# Patient Record
Sex: Female | Born: 1945 | Race: Black or African American | Hispanic: No | Smoking: Never smoker
Health system: Southern US, Community
[De-identification: ages and names within clinical notes are randomized; demographics above are authoritative.]

## PROBLEM LIST (undated history)

## (undated) DIAGNOSIS — M545 Low back pain, unspecified: Secondary | ICD-10-CM

## (undated) DIAGNOSIS — R519 Headache, unspecified: Secondary | ICD-10-CM

## (undated) DIAGNOSIS — K219 Gastro-esophageal reflux disease without esophagitis: Secondary | ICD-10-CM

## (undated) DIAGNOSIS — Z86010 Personal history of colonic polyps: Secondary | ICD-10-CM

## (undated) DIAGNOSIS — D509 Iron deficiency anemia, unspecified: Secondary | ICD-10-CM

## (undated) DIAGNOSIS — G8929 Other chronic pain: Secondary | ICD-10-CM

## (undated) DIAGNOSIS — M797 Fibromyalgia: Secondary | ICD-10-CM

## (undated) DIAGNOSIS — M329 Systemic lupus erythematosus, unspecified: Secondary | ICD-10-CM

## (undated) DIAGNOSIS — R51 Headache: Secondary | ICD-10-CM

## (undated) DIAGNOSIS — M26609 Unspecified temporomandibular joint disorder, unspecified side: Secondary | ICD-10-CM

## (undated) DIAGNOSIS — Z853 Personal history of malignant neoplasm of breast: Secondary | ICD-10-CM

## (undated) DIAGNOSIS — M4800 Spinal stenosis, site unspecified: Secondary | ICD-10-CM

## (undated) DIAGNOSIS — I10 Essential (primary) hypertension: Secondary | ICD-10-CM

## (undated) DIAGNOSIS — K222 Esophageal obstruction: Secondary | ICD-10-CM

## (undated) DIAGNOSIS — F329 Major depressive disorder, single episode, unspecified: Secondary | ICD-10-CM

## (undated) DIAGNOSIS — M1990 Unspecified osteoarthritis, unspecified site: Secondary | ICD-10-CM

## (undated) DIAGNOSIS — K648 Other hemorrhoids: Secondary | ICD-10-CM

## (undated) DIAGNOSIS — F32A Depression, unspecified: Secondary | ICD-10-CM

## (undated) HISTORY — DX: Unspecified osteoarthritis, unspecified site: M19.90

## (undated) HISTORY — DX: Unspecified temporomandibular joint disorder, unspecified side: M26.609

## (undated) HISTORY — DX: Iron deficiency anemia, unspecified: D50.9

## (undated) HISTORY — DX: Personal history of colonic polyps: Z86.010

## (undated) HISTORY — PX: BUNIONECTOMY: SHX129

## (undated) HISTORY — DX: Other hemorrhoids: K64.8

## (undated) HISTORY — DX: Major depressive disorder, single episode, unspecified: F32.9

## (undated) HISTORY — DX: Gastro-esophageal reflux disease without esophagitis: K21.9

## (undated) HISTORY — DX: Fibromyalgia: M79.7

## (undated) HISTORY — DX: Systemic lupus erythematosus, unspecified: M32.9

## (undated) HISTORY — DX: Headache, unspecified: R51.9

## (undated) HISTORY — PX: ABDOMINAL HYSTERECTOMY: SHX81

## (undated) HISTORY — DX: Headache: R51

## (undated) HISTORY — PX: APPENDECTOMY: SHX54

## (undated) HISTORY — DX: Other chronic pain: G89.29

## (undated) HISTORY — DX: Depression, unspecified: F32.A

## (undated) HISTORY — PX: PITUITARY EXCISION: SHX745

## (undated) HISTORY — PX: BREAST LUMPECTOMY: SHX2

## (undated) HISTORY — DX: Essential (primary) hypertension: I10

## (undated) HISTORY — PX: EYE SURGERY: SHX253

## (undated) HISTORY — DX: Low back pain: M54.5

## (undated) HISTORY — DX: Low back pain, unspecified: M54.50

## (undated) HISTORY — DX: Spinal stenosis, site unspecified: M48.00

## (undated) HISTORY — DX: Personal history of malignant neoplasm of breast: Z85.3

## (undated) HISTORY — DX: Esophageal obstruction: K22.2

---

## 2000-05-24 ENCOUNTER — Encounter: Admission: RE | Admit: 2000-05-24 | Discharge: 2000-05-24 | Payer: Self-pay | Admitting: Endocrinology

## 2000-06-23 ENCOUNTER — Encounter: Admission: RE | Admit: 2000-06-23 | Discharge: 2000-06-23 | Payer: Self-pay | Admitting: Internal Medicine

## 2000-06-29 ENCOUNTER — Encounter: Payer: Self-pay | Admitting: Internal Medicine

## 2000-06-29 ENCOUNTER — Encounter: Admission: RE | Admit: 2000-06-29 | Discharge: 2000-06-29 | Payer: Self-pay | Admitting: Hematology and Oncology

## 2000-06-29 ENCOUNTER — Ambulatory Visit (HOSPITAL_COMMUNITY): Admission: RE | Admit: 2000-06-29 | Discharge: 2000-06-29 | Payer: Self-pay | Admitting: Hematology and Oncology

## 2000-07-01 ENCOUNTER — Encounter: Admission: RE | Admit: 2000-07-01 | Discharge: 2000-07-01 | Payer: Self-pay | Admitting: Hematology and Oncology

## 2000-07-18 ENCOUNTER — Emergency Department (HOSPITAL_COMMUNITY): Admission: EM | Admit: 2000-07-18 | Discharge: 2000-07-18 | Payer: Self-pay | Admitting: Emergency Medicine

## 2000-12-20 ENCOUNTER — Emergency Department (HOSPITAL_COMMUNITY): Admission: EM | Admit: 2000-12-20 | Discharge: 2000-12-20 | Payer: Self-pay | Admitting: Emergency Medicine

## 2000-12-22 ENCOUNTER — Emergency Department (HOSPITAL_COMMUNITY): Admission: EM | Admit: 2000-12-22 | Discharge: 2000-12-23 | Payer: Self-pay | Admitting: Emergency Medicine

## 2000-12-23 ENCOUNTER — Encounter: Payer: Self-pay | Admitting: Emergency Medicine

## 2003-03-28 ENCOUNTER — Encounter: Payer: Self-pay | Admitting: Emergency Medicine

## 2003-03-28 ENCOUNTER — Emergency Department (HOSPITAL_COMMUNITY): Admission: EM | Admit: 2003-03-28 | Discharge: 2003-03-28 | Payer: Self-pay | Admitting: Emergency Medicine

## 2003-05-10 ENCOUNTER — Encounter: Admission: RE | Admit: 2003-05-10 | Discharge: 2003-05-10 | Payer: Self-pay | Admitting: Rheumatology

## 2003-05-10 ENCOUNTER — Encounter: Payer: Self-pay | Admitting: Rheumatology

## 2003-05-11 ENCOUNTER — Encounter: Payer: Self-pay | Admitting: Internal Medicine

## 2003-05-11 ENCOUNTER — Ambulatory Visit (HOSPITAL_COMMUNITY): Admission: RE | Admit: 2003-05-11 | Discharge: 2003-05-11 | Payer: Self-pay | Admitting: Internal Medicine

## 2003-05-15 ENCOUNTER — Encounter: Payer: Self-pay | Admitting: Internal Medicine

## 2003-05-15 ENCOUNTER — Ambulatory Visit (HOSPITAL_COMMUNITY): Admission: RE | Admit: 2003-05-15 | Discharge: 2003-05-15 | Payer: Self-pay | Admitting: Internal Medicine

## 2003-05-18 ENCOUNTER — Encounter: Admission: RE | Admit: 2003-05-18 | Discharge: 2003-05-18 | Payer: Self-pay | Admitting: Rheumatology

## 2003-05-18 ENCOUNTER — Encounter: Payer: Self-pay | Admitting: Rheumatology

## 2003-08-05 ENCOUNTER — Ambulatory Visit (HOSPITAL_COMMUNITY): Admission: RE | Admit: 2003-08-05 | Discharge: 2003-08-05 | Payer: Self-pay | Admitting: Internal Medicine

## 2003-08-05 ENCOUNTER — Encounter: Payer: Self-pay | Admitting: Internal Medicine

## 2003-08-17 ENCOUNTER — Ambulatory Visit (HOSPITAL_COMMUNITY): Admission: RE | Admit: 2003-08-17 | Discharge: 2003-08-17 | Payer: Self-pay | Admitting: Endocrinology

## 2003-08-17 ENCOUNTER — Encounter: Payer: Self-pay | Admitting: Endocrinology

## 2003-08-17 ENCOUNTER — Encounter (INDEPENDENT_AMBULATORY_CARE_PROVIDER_SITE_OTHER): Payer: Self-pay | Admitting: *Deleted

## 2003-10-06 ENCOUNTER — Emergency Department (HOSPITAL_COMMUNITY): Admission: EM | Admit: 2003-10-06 | Discharge: 2003-10-07 | Payer: Self-pay | Admitting: Emergency Medicine

## 2003-10-08 ENCOUNTER — Inpatient Hospital Stay (HOSPITAL_COMMUNITY): Admission: EM | Admit: 2003-10-08 | Discharge: 2003-10-13 | Payer: Self-pay | Admitting: Emergency Medicine

## 2003-10-11 ENCOUNTER — Encounter: Payer: Self-pay | Admitting: Gastroenterology

## 2003-12-11 ENCOUNTER — Encounter: Payer: Self-pay | Admitting: Gastroenterology

## 2003-12-11 ENCOUNTER — Ambulatory Visit (HOSPITAL_COMMUNITY): Admission: RE | Admit: 2003-12-11 | Discharge: 2003-12-11 | Payer: Self-pay | Admitting: Gastroenterology

## 2003-12-23 ENCOUNTER — Emergency Department (HOSPITAL_COMMUNITY): Admission: EM | Admit: 2003-12-23 | Discharge: 2003-12-23 | Payer: Self-pay | Admitting: Emergency Medicine

## 2004-05-02 ENCOUNTER — Ambulatory Visit (HOSPITAL_COMMUNITY): Admission: RE | Admit: 2004-05-02 | Discharge: 2004-05-03 | Payer: Self-pay | Admitting: Ophthalmology

## 2004-06-11 ENCOUNTER — Encounter: Admission: RE | Admit: 2004-06-11 | Discharge: 2004-06-11 | Payer: Self-pay | Admitting: Internal Medicine

## 2004-07-05 ENCOUNTER — Emergency Department (HOSPITAL_COMMUNITY): Admission: EM | Admit: 2004-07-05 | Discharge: 2004-07-06 | Payer: Self-pay | Admitting: Emergency Medicine

## 2004-11-26 DIAGNOSIS — Z86010 Personal history of colonic polyps: Secondary | ICD-10-CM

## 2004-11-26 DIAGNOSIS — Z860101 Personal history of adenomatous and serrated colon polyps: Secondary | ICD-10-CM

## 2004-11-26 HISTORY — DX: Personal history of adenomatous and serrated colon polyps: Z86.0101

## 2004-11-26 HISTORY — DX: Personal history of colonic polyps: Z86.010

## 2004-12-03 ENCOUNTER — Ambulatory Visit: Payer: Self-pay | Admitting: Endocrinology

## 2004-12-05 ENCOUNTER — Encounter: Payer: Self-pay | Admitting: Gastroenterology

## 2004-12-18 ENCOUNTER — Ambulatory Visit: Payer: Self-pay | Admitting: Internal Medicine

## 2004-12-21 ENCOUNTER — Encounter: Payer: Self-pay | Admitting: Gastroenterology

## 2005-01-06 ENCOUNTER — Ambulatory Visit: Payer: Self-pay | Admitting: Internal Medicine

## 2005-07-09 ENCOUNTER — Ambulatory Visit: Payer: Self-pay | Admitting: Internal Medicine

## 2005-10-13 ENCOUNTER — Ambulatory Visit: Payer: Self-pay | Admitting: Internal Medicine

## 2006-01-06 ENCOUNTER — Ambulatory Visit: Payer: Self-pay | Admitting: Internal Medicine

## 2006-01-07 ENCOUNTER — Ambulatory Visit: Payer: Self-pay | Admitting: Internal Medicine

## 2006-01-26 ENCOUNTER — Ambulatory Visit (HOSPITAL_COMMUNITY): Admission: RE | Admit: 2006-01-26 | Discharge: 2006-01-26 | Payer: Self-pay | Admitting: Internal Medicine

## 2006-02-01 ENCOUNTER — Ambulatory Visit: Payer: Self-pay | Admitting: Internal Medicine

## 2006-02-24 ENCOUNTER — Ambulatory Visit (HOSPITAL_COMMUNITY): Admission: RE | Admit: 2006-02-24 | Discharge: 2006-02-24 | Payer: Self-pay | Admitting: Internal Medicine

## 2006-02-26 ENCOUNTER — Ambulatory Visit (HOSPITAL_COMMUNITY): Admission: RE | Admit: 2006-02-26 | Discharge: 2006-02-26 | Payer: Self-pay | Admitting: Neurosurgery

## 2006-03-12 ENCOUNTER — Ambulatory Visit: Payer: Self-pay | Admitting: Internal Medicine

## 2006-03-16 ENCOUNTER — Ambulatory Visit: Payer: Self-pay | Admitting: Gastroenterology

## 2006-04-13 ENCOUNTER — Ambulatory Visit: Payer: Self-pay | Admitting: Gastroenterology

## 2006-07-01 ENCOUNTER — Ambulatory Visit: Payer: Self-pay | Admitting: Internal Medicine

## 2006-12-17 ENCOUNTER — Ambulatory Visit: Payer: Self-pay | Admitting: Internal Medicine

## 2006-12-17 LAB — CONVERTED CEMR LAB
BUN: 9 mg/dL (ref 6–23)
CO2: 31 meq/L (ref 19–32)
Calcium: 9.2 mg/dL (ref 8.4–10.5)
Glucose, Bld: 101 mg/dL — ABNORMAL HIGH (ref 70–99)
Sodium: 142 meq/L (ref 135–145)

## 2007-05-13 ENCOUNTER — Ambulatory Visit: Payer: Self-pay | Admitting: Internal Medicine

## 2007-05-18 ENCOUNTER — Encounter: Admission: RE | Admit: 2007-05-18 | Discharge: 2007-05-18 | Payer: Self-pay | Admitting: Internal Medicine

## 2007-05-21 DIAGNOSIS — K219 Gastro-esophageal reflux disease without esophagitis: Secondary | ICD-10-CM | POA: Insufficient documentation

## 2007-05-21 DIAGNOSIS — I10 Essential (primary) hypertension: Secondary | ICD-10-CM | POA: Insufficient documentation

## 2007-05-21 DIAGNOSIS — Z853 Personal history of malignant neoplasm of breast: Secondary | ICD-10-CM | POA: Insufficient documentation

## 2007-05-21 DIAGNOSIS — R519 Headache, unspecified: Secondary | ICD-10-CM | POA: Insufficient documentation

## 2007-05-21 DIAGNOSIS — E0590 Thyrotoxicosis, unspecified without thyrotoxic crisis or storm: Secondary | ICD-10-CM | POA: Insufficient documentation

## 2007-05-21 DIAGNOSIS — R51 Headache: Secondary | ICD-10-CM | POA: Insufficient documentation

## 2007-05-26 ENCOUNTER — Ambulatory Visit: Payer: Self-pay | Admitting: Pulmonary Disease

## 2007-06-14 ENCOUNTER — Ambulatory Visit (HOSPITAL_BASED_OUTPATIENT_CLINIC_OR_DEPARTMENT_OTHER): Admission: RE | Admit: 2007-06-14 | Discharge: 2007-06-14 | Payer: Self-pay | Admitting: Pulmonary Disease

## 2007-06-14 ENCOUNTER — Encounter: Payer: Self-pay | Admitting: Internal Medicine

## 2007-06-24 ENCOUNTER — Encounter: Payer: Self-pay | Admitting: Internal Medicine

## 2007-06-25 ENCOUNTER — Ambulatory Visit: Payer: Self-pay | Admitting: Pulmonary Disease

## 2007-07-01 ENCOUNTER — Ambulatory Visit: Payer: Self-pay | Admitting: Pulmonary Disease

## 2007-07-01 ENCOUNTER — Ambulatory Visit: Payer: Self-pay | Admitting: Endocrinology

## 2007-07-03 ENCOUNTER — Encounter: Payer: Self-pay | Admitting: Endocrinology

## 2007-07-27 ENCOUNTER — Ambulatory Visit: Payer: Self-pay | Admitting: Endocrinology

## 2007-07-27 LAB — CONVERTED CEMR LAB
Cortisol, Plasma: 1.8 ug/dL
FSH: 56.3 milliintl units/mL
LH: 29.8 milliintl units/mL
Prolactin: 7.6 ng/mL

## 2007-08-29 ENCOUNTER — Telehealth: Payer: Self-pay | Admitting: Internal Medicine

## 2007-09-12 ENCOUNTER — Telehealth (INDEPENDENT_AMBULATORY_CARE_PROVIDER_SITE_OTHER): Payer: Self-pay | Admitting: *Deleted

## 2007-09-14 ENCOUNTER — Ambulatory Visit: Payer: Self-pay | Admitting: Internal Medicine

## 2007-09-14 DIAGNOSIS — M48061 Spinal stenosis, lumbar region without neurogenic claudication: Secondary | ICD-10-CM | POA: Insufficient documentation

## 2007-09-16 ENCOUNTER — Ambulatory Visit: Payer: Self-pay | Admitting: Endocrinology

## 2007-09-16 DIAGNOSIS — E237 Disorder of pituitary gland, unspecified: Secondary | ICD-10-CM | POA: Insufficient documentation

## 2007-09-19 LAB — CONVERTED CEMR LAB
CO2: 32 meq/L (ref 19–32)
Calcium: 9.2 mg/dL (ref 8.4–10.5)
Chloride: 102 meq/L (ref 96–112)
Creatinine, Ser: 0.8 mg/dL (ref 0.4–1.2)
Free T4: 0.6 ng/dL (ref 0.6–1.6)
GFR calc Af Amer: 93 mL/min
Potassium: 4.3 meq/L (ref 3.5–5.1)
TSH: 1.99 microintl units/mL (ref 0.35–5.50)

## 2007-10-03 ENCOUNTER — Encounter: Admission: RE | Admit: 2007-10-03 | Discharge: 2007-10-03 | Payer: Self-pay | Admitting: Neurosurgery

## 2007-10-05 ENCOUNTER — Telehealth: Payer: Self-pay | Admitting: Internal Medicine

## 2008-03-30 ENCOUNTER — Ambulatory Visit: Payer: Self-pay | Admitting: Internal Medicine

## 2008-03-30 DIAGNOSIS — Z860100 Personal history of colon polyps, unspecified: Secondary | ICD-10-CM | POA: Insufficient documentation

## 2008-03-30 DIAGNOSIS — D509 Iron deficiency anemia, unspecified: Secondary | ICD-10-CM | POA: Insufficient documentation

## 2008-03-30 DIAGNOSIS — G4710 Hypersomnia, unspecified: Secondary | ICD-10-CM | POA: Insufficient documentation

## 2008-03-30 DIAGNOSIS — R5381 Other malaise: Secondary | ICD-10-CM | POA: Insufficient documentation

## 2008-03-30 DIAGNOSIS — F329 Major depressive disorder, single episode, unspecified: Secondary | ICD-10-CM

## 2008-03-30 DIAGNOSIS — M2550 Pain in unspecified joint: Secondary | ICD-10-CM | POA: Insufficient documentation

## 2008-03-30 DIAGNOSIS — M545 Low back pain, unspecified: Secondary | ICD-10-CM | POA: Insufficient documentation

## 2008-03-30 DIAGNOSIS — R5383 Other fatigue: Secondary | ICD-10-CM

## 2008-03-30 DIAGNOSIS — F32A Depression, unspecified: Secondary | ICD-10-CM | POA: Insufficient documentation

## 2008-03-30 DIAGNOSIS — J309 Allergic rhinitis, unspecified: Secondary | ICD-10-CM | POA: Insufficient documentation

## 2008-03-30 LAB — CONVERTED CEMR LAB
ALT: 18 units/L (ref 0–35)
AST: 21 units/L (ref 0–37)
Albumin: 3.8 g/dL (ref 3.5–5.2)
Alkaline Phosphatase: 103 units/L (ref 39–117)
Basophils Absolute: 0.1 10*3/uL (ref 0.0–0.1)
Bilirubin, Direct: 0.1 mg/dL (ref 0.0–0.3)
Eosinophils Absolute: 0.2 10*3/uL (ref 0.0–0.7)
GFR calc Af Amer: 93 mL/min
GFR calc non Af Amer: 77 mL/min
Glucose, Bld: 90 mg/dL (ref 70–99)
Lymphocytes Relative: 48 % — ABNORMAL HIGH (ref 12.0–46.0)
MCV: 88.1 fL (ref 78.0–100.0)
Monocytes Absolute: 0.4 10*3/uL (ref 0.1–1.0)
Neutrophils Relative %: 40 % — ABNORMAL LOW (ref 43.0–77.0)
Nitrite: NEGATIVE
Potassium: 4.4 meq/L (ref 3.5–5.1)
RDW: 13.6 % (ref 11.5–14.6)
Sed Rate: 10 mm/hr (ref 0–22)
Specific Gravity, Urine: 1.01 (ref 1.000–1.03)
TSH: 1.62 microintl units/mL (ref 0.35–5.50)
Total Protein, Urine: NEGATIVE mg/dL
Total Protein: 6.9 g/dL (ref 6.0–8.3)
Urine Glucose: NEGATIVE mg/dL
WBC: 5.4 10*3/uL (ref 4.5–10.5)
pH: 6 (ref 5.0–8.0)

## 2008-04-03 LAB — CONVERTED CEMR LAB: ds DNA Ab: <1 (ref <5)

## 2008-04-06 ENCOUNTER — Telehealth: Payer: Self-pay | Admitting: Internal Medicine

## 2008-04-25 ENCOUNTER — Telehealth: Payer: Self-pay | Admitting: Internal Medicine

## 2008-05-10 ENCOUNTER — Ambulatory Visit: Payer: Self-pay | Admitting: Internal Medicine

## 2008-05-10 DIAGNOSIS — R82998 Other abnormal findings in urine: Secondary | ICD-10-CM | POA: Insufficient documentation

## 2008-05-10 LAB — CONVERTED CEMR LAB
Bilirubin Urine: NEGATIVE
Nitrite: NEGATIVE
Urine Glucose: NEGATIVE mg/dL
pH: 6 (ref 5.0–8.0)

## 2008-05-15 ENCOUNTER — Telehealth: Payer: Self-pay | Admitting: Internal Medicine

## 2008-05-22 ENCOUNTER — Telehealth: Payer: Self-pay | Admitting: Internal Medicine

## 2008-08-15 ENCOUNTER — Emergency Department (HOSPITAL_COMMUNITY): Admission: EM | Admit: 2008-08-15 | Discharge: 2008-08-15 | Payer: Self-pay | Admitting: Emergency Medicine

## 2008-08-16 ENCOUNTER — Telehealth: Payer: Self-pay | Admitting: Internal Medicine

## 2008-08-21 ENCOUNTER — Ambulatory Visit: Payer: Self-pay | Admitting: Internal Medicine

## 2008-08-27 ENCOUNTER — Telehealth: Payer: Self-pay | Admitting: Internal Medicine

## 2008-10-31 ENCOUNTER — Ambulatory Visit: Payer: Self-pay | Admitting: Internal Medicine

## 2008-10-31 DIAGNOSIS — M79609 Pain in unspecified limb: Secondary | ICD-10-CM | POA: Insufficient documentation

## 2008-10-31 DIAGNOSIS — N939 Abnormal uterine and vaginal bleeding, unspecified: Secondary | ICD-10-CM

## 2008-10-31 DIAGNOSIS — N926 Irregular menstruation, unspecified: Secondary | ICD-10-CM | POA: Insufficient documentation

## 2008-11-01 ENCOUNTER — Telehealth: Payer: Self-pay | Admitting: Internal Medicine

## 2008-11-28 ENCOUNTER — Encounter: Admission: RE | Admit: 2008-11-28 | Discharge: 2008-11-28 | Payer: Self-pay | Admitting: Obstetrics and Gynecology

## 2008-12-19 ENCOUNTER — Telehealth: Payer: Self-pay | Admitting: Internal Medicine

## 2008-12-26 ENCOUNTER — Encounter: Payer: Self-pay | Admitting: Internal Medicine

## 2009-05-07 ENCOUNTER — Ambulatory Visit: Payer: Self-pay | Admitting: Internal Medicine

## 2009-05-07 DIAGNOSIS — G4700 Insomnia, unspecified: Secondary | ICD-10-CM | POA: Insufficient documentation

## 2009-05-07 LAB — CONVERTED CEMR LAB
BUN: 11 mg/dL (ref 6–23)
Basophils Absolute: 0 10*3/uL (ref 0.0–0.1)
Basophils Relative: 0.7 % (ref 0.0–3.0)
Calcium: 9.2 mg/dL (ref 8.4–10.5)
Chloride: 101 meq/L (ref 96–112)
Eosinophils Absolute: 0.2 10*3/uL (ref 0.0–0.7)
Eosinophils Relative: 3.1 % (ref 0.0–5.0)
GFR calc non Af Amer: 92.66 mL/min (ref >60)
Glucose, Bld: 89 mg/dL (ref 70–99)
Hemoglobin: 13.3 g/dL (ref 12.0–15.0)
Lymphs Abs: 2.5 10*3/uL (ref 0.7–4.0)
MCHC: 34.8 g/dL (ref 30.0–36.0)
MCV: 89.4 fL (ref 78.0–100.0)
Monocytes Absolute: 0.5 10*3/uL (ref 0.1–1.0)
Potassium: 4.1 meq/L (ref 3.5–5.1)
RDW: 14.1 % (ref 11.5–14.6)
Sodium: 140 meq/L (ref 135–145)
TSH: 1.66 microintl units/mL (ref 0.35–5.50)

## 2009-05-12 ENCOUNTER — Encounter: Payer: Self-pay | Admitting: Internal Medicine

## 2009-05-14 ENCOUNTER — Telehealth: Payer: Self-pay | Admitting: Internal Medicine

## 2009-10-02 ENCOUNTER — Telehealth: Payer: Self-pay | Admitting: Internal Medicine

## 2009-11-18 ENCOUNTER — Telehealth: Payer: Self-pay | Admitting: Internal Medicine

## 2010-02-25 ENCOUNTER — Encounter: Payer: Self-pay | Admitting: Internal Medicine

## 2010-03-12 ENCOUNTER — Ambulatory Visit (HOSPITAL_COMMUNITY): Admission: RE | Admit: 2010-03-12 | Discharge: 2010-03-12 | Payer: Self-pay | Admitting: Ophthalmology

## 2010-05-27 ENCOUNTER — Telehealth: Payer: Self-pay | Admitting: Internal Medicine

## 2010-05-28 ENCOUNTER — Telehealth: Payer: Self-pay | Admitting: Internal Medicine

## 2010-06-10 ENCOUNTER — Ambulatory Visit: Payer: Self-pay | Admitting: Internal Medicine

## 2010-11-05 ENCOUNTER — Encounter (INDEPENDENT_AMBULATORY_CARE_PROVIDER_SITE_OTHER): Payer: Self-pay | Admitting: *Deleted

## 2010-11-06 ENCOUNTER — Ambulatory Visit
Admission: RE | Admit: 2010-11-06 | Discharge: 2010-11-06 | Payer: Self-pay | Source: Home / Self Care | Attending: Internal Medicine | Admitting: Internal Medicine

## 2010-11-06 ENCOUNTER — Other Ambulatory Visit: Payer: Self-pay | Admitting: Internal Medicine

## 2010-11-06 LAB — CBC WITH DIFFERENTIAL/PLATELET
Basophils Absolute: 0 10*3/uL (ref 0.0–0.1)
Basophils Relative: 0.4 % (ref 0.0–3.0)
Eosinophils Absolute: 0.2 10*3/uL (ref 0.0–0.7)
Eosinophils Relative: 3.2 % (ref 0.0–5.0)
HCT: 36.8 % (ref 36.0–46.0)
Hemoglobin: 12.7 g/dL (ref 12.0–15.0)
Lymphocytes Relative: 41.9 % (ref 12.0–46.0)
Lymphs Abs: 2.1 10*3/uL (ref 0.7–4.0)
MCHC: 34.6 g/dL (ref 30.0–36.0)
MCV: 88.2 fl (ref 78.0–100.0)
Monocytes Absolute: 0.6 10*3/uL (ref 0.1–1.0)
Monocytes Relative: 10.9 % (ref 3.0–12.0)
Neutro Abs: 2.2 10*3/uL (ref 1.4–7.7)
Neutrophils Relative %: 43.6 % (ref 43.0–77.0)
Platelets: 200 10*3/uL (ref 150.0–400.0)
RBC: 4.17 Mil/uL (ref 3.87–5.11)
RDW: 14.7 % — ABNORMAL HIGH (ref 11.5–14.6)
WBC: 5.1 10*3/uL (ref 4.5–10.5)

## 2010-11-06 LAB — LIPID PANEL
Cholesterol: 169 mg/dL (ref 0–200)
HDL: 78.7 mg/dL (ref >39.00)
LDL Cholesterol: 71 mg/dL (ref 0–99)
Total CHOL/HDL Ratio: 2
Triglycerides: 98 mg/dL (ref 0.0–149.0)
VLDL: 19.6 mg/dL (ref 0.0–40.0)

## 2010-11-06 LAB — TSH: TSH: 2.55 u[IU]/mL (ref 0.35–5.50)

## 2010-11-06 LAB — BASIC METABOLIC PANEL
BUN: 10 mg/dL (ref 6–23)
CO2: 26 mEq/L (ref 19–32)
Calcium: 8.3 mg/dL — ABNORMAL LOW (ref 8.4–10.5)
Chloride: 103 mEq/L (ref 96–112)
Creatinine, Ser: 1 mg/dL (ref 0.4–1.2)
GFR: 73.84 mL/min (ref >60.00)
Glucose, Bld: 95 mg/dL (ref 70–99)
Potassium: 4.3 mEq/L (ref 3.5–5.1)
Sodium: 139 mEq/L (ref 135–145)

## 2010-11-06 LAB — IBC PANEL
Iron: 52 ug/dL (ref 42–145)
Saturation Ratios: 11.8 % — ABNORMAL LOW (ref 20.0–50.0)
Transferrin: 313.5 mg/dL (ref 212.0–360.0)

## 2010-11-06 LAB — B12 AND FOLATE PANEL
Folate: 16.2 ng/mL
Vitamin B-12: 542 pg/mL (ref 211–911)

## 2010-11-06 LAB — HEPATIC FUNCTION PANEL
ALT: 18 U/L (ref 0–35)
AST: 26 U/L (ref 0–37)
Albumin: 3.5 g/dL (ref 3.5–5.2)
Alkaline Phosphatase: 76 U/L (ref 39–117)
Bilirubin, Direct: 0.1 mg/dL (ref 0.0–0.3)
Total Bilirubin: 0.5 mg/dL (ref 0.3–1.2)
Total Protein: 6.7 g/dL (ref 6.0–8.3)

## 2010-11-07 ENCOUNTER — Telehealth (INDEPENDENT_AMBULATORY_CARE_PROVIDER_SITE_OTHER): Payer: Self-pay | Admitting: *Deleted

## 2010-11-12 ENCOUNTER — Encounter
Admission: RE | Admit: 2010-11-12 | Discharge: 2010-11-12 | Payer: Self-pay | Source: Home / Self Care | Attending: Internal Medicine | Admitting: Internal Medicine

## 2010-11-12 LAB — HM MAMMOGRAPHY: HM Mammogram: NEGATIVE

## 2010-11-25 NOTE — Progress Notes (Signed)
Summary: METHADONE  Phone Note Refill Request Call back at Home Phone [phone] Call back at Work Phone [phone]   Refills Requested: Medication #1:  METHADONE HCL 10 MG  TABS 3 by mouth three times a day To be filled on or after 07/01/2009 Initial call taken by: Lamar Sprinkles, CMA,  November 18, 2009 1:11 PM  Follow-up for Phone Call        Why has it been so long since her last refill. She is ok for refills: 30 day supply x 3. Follow-up by: Jacques Navy MD,  November 18, 2009 10:31 PM  Additional Follow-up for Phone Call Additional follow up Details #1::        Pt aware rx's will be ready this afternoon. She has not had insurance and says she had to stretch the last rxs.  Additional Follow-up by: Lamar Sprinkles, CMA,  November 19, 2009 10:14 AM    New/Updated Medications: METHADONE HCL 10 MG  TABS (METHADONE HCL) 3 by mouth three times a day To be filled on or after 11/19/2009 METHADONE HCL 10 MG  TABS (METHADONE HCL) 3 by mouth three times a day To be filled on or after 12/20/2009 METHADONE HCL 10 MG  TABS (METHADONE HCL) 3 by mouth three times a day To be filled on or after 01/17/2010 Prescriptions: METHADONE HCL 10 MG  TABS (METHADONE HCL) 3 by mouth three times a day To be filled on or after 01/17/2010  #270 x 0   Entered by:   Lamar Sprinkles, CMA   Authorized by:   Jacques Navy MD   Signed by:   Lamar Sprinkles, CMA on 11/19/2009   Method used:   Print then Give to Patient   RxID:   [id number] METHADONE HCL 10 MG  TABS (METHADONE HCL) 3 by mouth three times a day To be filled on or after 12/20/2009  #270 x 0   Entered by:   Lamar Sprinkles, CMA   Authorized by:   Jacques Navy MD   Signed by:   Lamar Sprinkles, CMA on 11/19/2009   Method used:   Print then Give to Patient   RxID:   [id number] METHADONE HCL 10 MG  TABS (METHADONE HCL) 3 by mouth three times a day To be filled on or after 11/19/2009  #270 x 0   Entered by:   Lamar Sprinkles, CMA  Authorized by:   Jacques Navy MD   Signed by:   Lamar Sprinkles, CMA on 11/19/2009   Method used:   Print then Give to Patient   RxID:   [id number]

## 2010-11-25 NOTE — Progress Notes (Signed)
  Phone Note Refill Request Message from:  Fax from Pharmacy on May 27, 2010 1:29 PM  Refills Requested: Medication #1:  ESTRADIOL 2 MG  TABS 1/2 once daily   Supply Requested: 1 month   Last Refilled: 04/19/2010 Initial call taken by: Rock Nephew CMA,  May 27, 2010 1:29 PM    Prescriptions: ESTRADIOL 2 MG  TABS (ESTRADIOL) 1/2 once daily  #15 x 0   Entered by:   Rock Nephew CMA   Authorized by:   Jacques Navy MD   Signed by:   Rock Nephew CMA on 05/27/2010   Method used:   Faxed to ...       [facility] (retail)       [address]       [place], [place]  [address]       Ph: [phone]       Fax: [phone]   RxID:   [id number]

## 2010-11-25 NOTE — Progress Notes (Signed)
  Phone Note Refill Request Message from:  Fax from Pharmacy on May 28, 2010 11:46 AM  Refills Requested: Medication #1:  HYDROCHLOROTHIAZIDE 12.5 MG  TABS 1 once daily  Medication #2:  METHIMAZOLE 10 MG  TABS Take 1 tablet by mouth once a day Initial call taken by: Ami Bullins CMA,  May 28, 2010 11:46 AM    Prescriptions: HYDROCHLOROTHIAZIDE 12.5 MG  TABS (HYDROCHLOROTHIAZIDE) 1 once daily  #30 x 12   Entered by:   Ami Bullins CMA   Authorized by:   Jacques Navy MD   Signed by:   Bill Salinas CMA on 05/28/2010   Method used:   Faxed to ...       [facility] (retail)       [address]       [place], [place]  [address]       Ph: [phone]       Fax: [phone]   RxID:   [id number] METHIMAZOLE 10 MG  TABS (METHIMAZOLE) Take 1 tablet by mouth once a day  #30 x 12   Entered by:   Ami Bullins CMA   Authorized by:   Jacques Navy MD   Signed by:   Bill Salinas CMA on 05/28/2010   Method used:   Faxed to ...       [facility] (retail)       [address]       [place], [place]  [address]       Ph: [phone]       Fax: [phone]   RxID:   [id number]

## 2010-11-25 NOTE — Assessment & Plan Note (Signed)
Summary: lower back pain going down to thigh-lb   Vital Signs:  Patient profile:   65 year old female Height:      67 inches Weight:      173 pounds BMI:     27.19 O2 Sat:      96 % on Room air Temp:     98.2 degrees F oral Pulse rate:   79 / minute BP sitting:   122 / 84  (right arm) Cuff size:   regular  Vitals Entered By: Bill Salinas CMA (June 10, 2010 9:35 AM)  O2 Flow:  Room air CC: pt c/o pain in her lower back. Pt states the pain has been present for many years, but she has exp. extreme pain for about 1 week now/ ab Comments pt needs refill on Zolpidem and meloxicam   Primary Care Provider:  norins  CC:  pt c/o pain in her lower back. Pt states the pain has been present for many years and but she has exp. extreme pain for about 1 week now/ ab.  History of Present Illness: Patient presents for acute pain at the iliac crest left with radiation to the front of her thigh which began last week after moving furniture. No paresthesia, she did have some motor weakness of the right leg Sunday and it is better. The pain is better as well.   Reviewed pain medication alternatives: morphine, oxycodone.   Current Medications (verified): 1)  Methadone Hcl 10 Mg  Tabs (Methadone Hcl) .... 3 By Mouth Three Times A Day To Be Filled On or After 01/17/2010 2)  Aspirin Low Dose 81 Mg  Tabs (Aspirin) .[name].. 1 Once Daily 3)  Estradiol 2 Mg  Tabs (Estradiol) .... 1/2 Once Daily 4)  Methimazole 10 Mg  Tabs (Methimazole) .... Take 1 Tablet By Mouth Once A Day 5)  Hydrochlorothiazide 12.5 Mg  Tabs (Hydrochlorothiazide) .[name].. 1 Once Daily 6)  Vitamin D3 1000 Unit Tabs (Cholecalciferol) .... Take 1 Tablet By Mouth Once A Day 7)  Zolpidem Tartrate 10 Mg Tabs (Zolpidem Tartrate) .[name].. 1 By Mouth At Bedtime X 5 Nights Then Every Third Night. 8)  Meloxicam 15 Mg Tabs (Meloxicam) .[name].. 1 By Mouth Once Daily  Allergies (verified): 1)  Darvocet-N 100  Past History:  Past Medical History: Last updated:  03/30/2008 sle per dr deveshwar ? 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  Breast Biopsy Bunionectomy  s/p right TMJ s/p left eye surgury  Family History: Last updated: 03/30/2008 no h/o pituitary dz mother with HTN, arrythmia, gout sister with thyroid disease, and DM son with HTN  Social History: Last updated: 05/10/2008 Divorced-after 26 years Never Smoked housing counselor/trained paralegal Alcohol use-yes 2 children: 1 son - ''22; 1 daughter -'60; 3 grandchildren  Review of Systems  The patient denies anorexia, fever, weight loss, weight gain, chest pain, syncope, dyspnea on exertion, prolonged cough, abdominal pain, incontinence, difficulty walking, abnormal bleeding, and angioedema.    Physical Exam  General:  alert, well-developed, and well-nourished.   Head:  normocephalic and no abnormalities observed.   Eyes:  vision grossly intact, pupils equal, pupils round, and corneas and lenses clear.   Lungs:  normal respiratory effort, normal breath sounds, no crackles, and no  wheezes.   Heart:  normal rate and regular rhythm.   Msk:  back- nl stand, flex, gait, toe/heel walk, step-up to exam, SLR sitting, DTR at patellar tendon. No pain with internal and external rotation of the right hip. Tender to palpation over the right SI joint and in the region of the periformis.  Left hand with joint enlargement DIP and PIP and MCP joint with interosseous wasting.  Pulses:  2+ radial Neurologic:  alert & oriented X3, cranial nerves II-XII intact, and strength normal in all extremities.   Skin:  turgor normal and color normal.     Psych:  Oriented X3, memory intact for recent and remote, normally interactive, and good eye contact.     Impression & Recommendations:  Problem # 1:  LOW BACK PAIN (ICD-724.2) Exam without radicular signs. Suspect muscle strain which is already resolving.  Plan - heat, massage, resume use of NSAID  Her updated medication list for this problem includes:    Methadone Hcl 10 Mg Tabs (Methadone hcl) .Marland KitchenMarland KitchenMarland KitchenMarland Kitchen 3 by mouth three times a day to be filled on or after 08/10/2010    Aspirin Low Dose 81 Mg Tabs (Aspirin) .[name]... 1 once daily    Meloxicam 15 Mg Tabs (Meloxicam) .[name]... 1 by mouth once daily  Problem # 2:  HAND PAIN, BILATERAL (ICD-729.5) Progressive OA much worse left hand.  Plan - heat immersion in AM for gel period           exercise using soft ball daily           NSAIDs  Problem # 3:  GERD (ICD-530.81) Flare of sympotms noted. She will also be resuming NSAIDs.  Plan - trial of H2 blocker full-dose therapy           PPI therapy if H2's fail  Her updated medication list for this problem includes:    Ranitidine Hcl 150 Mg Caps (Ranitidine hcl) .[name]... 1 by mouth two times a day  Problem # 4:  SPINAL STENOSIS OF LUMBAR REGION (ICD-724.02) chronic pain meds reviewed, for cost reasons as well as efficacy to-date will continue with methadone.  Plan - 3 x 30 day supply Rx provided.   Complete Medication List: 1)  Methadone Hcl 10 Mg Tabs (Methadone hcl) .... 3 by mouth three times a day to be filled on or after 08/10/2010 2)  Aspirin Low Dose 81 Mg Tabs (Aspirin) .[name].. 1 once daily 3)  Estradiol 2 Mg Tabs (Estradiol) .... 1/2 once daily 4)  Methimazole 10 Mg Tabs (Methimazole) .... Take 1 tablet by mouth once a day 5)  Hydrochlorothiazide 12.5 Mg Tabs (Hydrochlorothiazide) .[name].. 1 once daily 6)  Vitamin D3 1000 Unit Tabs (Cholecalciferol) .... Take 1 tablet by mouth once a day 7)  Zolpidem Tartrate 10 Mg Tabs (Zolpidem tartrate) .[name].. 1 by mouth at bedtime x 5 nights then every third  night. 8)  Meloxicam 15 Mg Tabs (Meloxicam) .[name].. 1 by mouth once daily 9)  Ranitidine Hcl 150 Mg Caps (Ranitidine hcl) .[name].. 1 by mouth two times a day  Patient Instructions: 1)  Back pain - no sign of nerve damage or pinched nerve. Most likely muscle strain from over use. Plan - heat, stretching, meloxicam once a day. 2)  Arhtritis hand - changes in the joints of the left thumb with accompanying muscle change between thumb and finger. Plan - use a soft ball to keep up mobility and strength. 3)  Stomach pain - trial of H2 blocker, i.e.  Rantidine 150mg  AM and bedtime (OTC). If this fails let me know and we will prescribe omeprazole = generic prilosec 4)  Pain managment as little as possible.  Prescriptions: METHADONE HCL 10 MG  TABS (METHADONE HCL) 3 by mouth three times a day To be filled on or after 08/10/2010  #270 x 0   Entered by:   Ami Bullins CMA   Authorized by:   Jacques Navy MD   Signed by:   Bill Salinas CMA on 06/10/2010   Method used:   Print then Give to Patient   RxID:   [id number] METHADONE HCL 10 MG  TABS (METHADONE HCL) 3 by mouth three times a day To be filled on or after 07/11/2010  #270 x 0   Entered by:   Ami Bullins CMA   Authorized by:   Jacques Navy MD   Signed by:   Bill Salinas CMA on 06/10/2010   Method used:   Print then Give to Patient   RxID:   [id number] METHADONE HCL 10 MG  TABS (METHADONE HCL) 3 by mouth three times a day To be filled on or after 06/10/2010  #270 x 0   Entered by:   Ami Bullins CMA   Authorized by:   Jacques Navy MD   Signed by:   Bill Salinas CMA on 06/10/2010   Method used:   Print then Give to Patient   RxID:   [id number] MELOXICAM 15 MG TABS (MELOXICAM) 1 by mouth once daily  #30 x 12   Entered and Authorized by:   Jacques Navy MD   Signed by:   Jacques Navy MD on 06/10/2010   Method used:   Print then Give to Patient   RxID:   [id number] ZOLPIDEM TARTRATE 10 MG TABS (ZOLPIDEM TARTRATE) 1  by mouth at bedtime x 5 nights then every third night.  #30 x 5   Entered and Authorized by:   Jacques Navy MD   Signed by:   Jacques Navy MD on 06/10/2010   Method used:   Print then Give to Patient   RxID:   [id number]    Immunization History:  Tetanus/Td Immunization History:    Tetanus/Td:  due (06/10/2010)  Influenza Immunization History:    Influenza:  declined (06/10/2010)

## 2010-11-25 NOTE — Letter (Signed)
Summary: [facility]  [facility]   Imported By: Sherian Rein 03/13/2010 14:56:49  _____________________________________________________________________  External Attachment:    Type:   Image     Comment:   External Document

## 2010-11-27 NOTE — Assessment & Plan Note (Signed)
Summary: bloating/digestive issue/cd   Vital Signs:  Patient profile:   65 year old female Weight:      175 pounds Temp:     97.8 degrees F Pulse rate:   64 / minute BP sitting:   140 / 78  (left arm) Cuff size:   regular  Vitals Entered By: [name] [name], CMA (November 06, 2010 10:45 AM) CC: Needs Methadone rfs/21mth. Also c/o food "not digesting well" , Tagament has not given relief.    Primary Care Provider:  Emelina [name]  CC:  Needs Methadone rfs/50mth. Also c/o food "not digesting well"  and Tagament has not given relief. .  History of Present Illness: [name] presents for renewal of chronic pain medications. She has been doing well with no constipation, no mental status changes or drug reaction. she has been regular in her refills.  She reports that this past tuesday she had sudden on-set of severe abdominal discomfort. She attributes this to something she ate. She eventually had an episode of emesis and felt much better.  On questioning her she does have a problem with mild dysphagia: slow transit to stomach and occasional need to regurgitate. She takes an H2 blocker daily but denies any reflux or dyspepsia. She does take meloxicam daily. She has had no trouble with her bowels.  At today's visit she requests referral to gyn - she can return to [name] [name] without referral since she established in  2010. She will be referred for a long overdue mammogram in a setting of previous breast cancer.   [name] tells me that she is ready to proceed with screening colonoscopy. She does have an appointment Jan 21s and can discuss this with her gastroenterologist.   Current Medications (verified): 1)  Methadone Hcl 10 Mg  Tabs (Methadone Hcl) .... 3 By Mouth Three Times A Day To Be Filled On or After 08/10/2010 2)  Aspirin Low Dose 81 Mg  Tabs (Aspirin) .[name].. 1 Once Daily 3)  Estradiol 2 Mg  Tabs (Estradiol) .... 1/2 Once Daily 4)  Methimazole 10 Mg  Tabs (Methimazole) .... Take 1 Tablet By  Mouth Once A Day 5)  Hydrochlorothiazide 12.5 Mg  Tabs (Hydrochlorothiazide) .[name].. 1 Once Daily 6)  Vitamin D3 1000 Unit Tabs (Cholecalciferol) .... Take 1 Tablet By Mouth Once A Day 7)  Zolpidem Tartrate 10 Mg Tabs (Zolpidem Tartrate) .[name].. 1 By Mouth At Bedtime X 5 Nights Then Every Third Night. 8)  Meloxicam 15 Mg Tabs (Meloxicam) .[name].. 1 By Mouth Once Daily 9)  Ranitidine Hcl 150 Mg Caps (Ranitidine Hcl) .[name].. 1 By Mouth Two Times A Day 10)  Tagamet Hb 200 Mg Tabs (Cimetidine) .[name].. 1 Once Daily  Allergies (verified): 1)  Darvocet-N 100  Past History:  Past Medical History: Last updated: 03/30/2008 sle per dr deveshwar ? 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  Breast Biopsy Bunionectomy  s/p right TMJ s/p left eye surgury  Family History: Last updated: 03/30/2008 no h/o pituitary dz mother with HTN, arrythmia, gout sister with thyroid disease, and DM son with HTN  Social History: Last updated: 05/10/2008 Divorced-after 26 years Never Smoked housing counselor/trained paralegal Alcohol use-yes 2 children: 1 son - ''83; 1 daughter -'76; 3 grandchildren  Review  of Systems  The patient denies anorexia, fever, weight loss, weight gain, decreased hearing, chest pain, dyspnea on exertion, prolonged cough, headaches, abdominal pain, severe indigestion/heartburn, muscle weakness, transient blindness, unusual weight change, enlarged lymph nodes, and angioedema.    Physical Exam  General:  alert, well-developed, well-nourished, well-hydrated, and normal appearance.   Head:  normocephalic and atraumatic.   Eyes:  pupils equal,  pupils round, and corneas and lenses clear.   Neck:  supple.   Lungs:  normal respiratory effort and normal breath sounds.   Heart:  normal rate, regular rhythm, and no gallop.   Neurologic:  alert & oriented X3, cranial nerves II-XII intact, and gait normal.   Skin:  turgor normal and color normal.   Psych:  Oriented X3, memory intact for recent and remote, normally interactive, and good eye contact.     Impression & Recommendations:  Problem # 1:  POLYARTHRALGIA (ICD-719.49) Patient with chronic discomfort who seems to be doing OK at today's visit.   Plan - no change in regimen - meloxicam  Orders: TLB-CBC Platelet - w/Differential (85025-CBCD)  addendum - CBC normal  Problem # 2:  SPINAL STENOSIS OF LUMBAR REGION (ICD-724.02) Chronic pain as a result. She is stable on her present pain regimen.  Plan - 3 months refills  Problem # 3:  GERD (ICD-530.81) Patient denies pain but does have mild dysphagia.  Plan - continue present therapy.           Keep appointment with [name] [name] - may need further evaluation.  Her updated medication list for this problem includes:    Ranitidine Hcl 150 Mg Caps (Ranitidine hcl) .[name]... 1 by mouth two times a day    Tagamet Hb 200 Mg Tabs (Cimetidine) .[name]... 1 once daily  Problem # 4:  HYPERTENSION (ICD-401.9)  Her updated medication list for this problem includes:    Hydrochlorothiazide 12.5 Mg Tabs (Hydrochlorothiazide) .[name]... 1 once daily  Orders: TLB-BMP (Basic Metabolic Panel-BMET) (80048-METABOL)  BP today: 140/78 Prior BP: 122/84 (06/10/2010)  BP mildly elevated. Plan - continue present meds. If BP continues to run 140+ will adjust medications.  Problem # 5:  HYPERTHYROIDISM (ICD-242.90) On methimizole. due for labs with recommendations to follow.  Her updated medication list for this problem includes:    Methimazole 10 Mg Tabs (Methimazole) .[name]... Take 1 tablet by mouth once a day  Orders: TLB-TSH (Thyroid Stimulating Hormone)  (84443-TSH)  Addendum - TSH is normal range.  Continue present medications  Problem # 6:  Preventive Health Care (ICD-V70.0) [facility]. Patient is willing to proceed with colonoscopy. Routine labs ordered.   Orders: TLB-Lipid Panel (80061-LIPID) TLB-Hepatic/Liver Function Pnl (80076-HEPATIC) TLB-IBC Pnl (Iron/FE;Transferrin) (83550-IBC) TLB-B12 + Folate Pnl (95621_30865-H84/ONG)  Addendum - excellent lipid panel. Normal hemoglobin but iron levels are low and iron supplement is recommended. B12 level is normal.  Complete Medication List: 1)  Methadone Hcl 10 Mg Tabs (Methadone hcl) .... 3 by mouth three times a day to be filled on or after 01/05/2011 2)  Aspirin Low Dose 81 Mg Tabs (Aspirin) .[name].. 1 once daily 3)  Estradiol 2 Mg Tabs (Estradiol) .... 1/2 once daily 4)  Methimazole 10 Mg Tabs (Methimazole) .... Take 1 tablet by mouth once a day 5)  Hydrochlorothiazide 12.5 Mg Tabs (Hydrochlorothiazide) .[name].. 1 once daily 6)  Vitamin D3 1000 Unit Tabs (Cholecalciferol) .... Take 1 tablet by mouth once a day 7)  Zolpidem Tartrate 10 Mg Tabs (Zolpidem tartrate) .Marland KitchenMarland KitchenMarland Kitchen 1  by mouth at bedtime x 5 nights then every third night. 8)  Meloxicam 15 Mg Tabs (Meloxicam) .[name].. 1 by mouth once daily 9)  Ranitidine Hcl 150 Mg Caps (Ranitidine hcl) .[name].. 1 by mouth two times a day 10)  Tagamet Hb 200 Mg Tabs (Cimetidine) .[name].. 1 once daily  Other Orders: Radiology Referral (Radiology)  Patient: [name] [name] Note: All result statuses are Final unless otherwise noted.  Tests: (1) BMP (METABOL)   Sodium                    139 mEq/L                   135-145   Potassium                 4.3 mEq/L                   3.5-5.1   Chloride                  103 mEq/L                   96-112   Carbon Dioxide            26 mEq/L                    19-32   Glucose                   95 mg/dL                    45-40   BUN                       10 mg/dL                     9-81   Creatinine                1.0 mg/dL                   1.9-1.4   Calcium              [L]  8.3 mg/dL                   7.8-29.5   GFR                       73.84 mL/min                >60.00  Tests: (2) TSH (TSH)   FastTSH                   2.55 uIU/mL                 0.35-5.50  Tests: (3) CBC Platelet w/Diff (CBCD)   White Cell Count          5.1 K/uL                    4.5-10.5   Red Cell Count            4.17 Mil/uL                 3.87-5.11   Hemoglobin                12.7 g/dL  12.0-15.0   Hematocrit                36.8 %                      36.0-46.0   MCV                       88.2 fl                     78.0-100.0   MCHC                      34.6 g/dL                   96.0-45.4   RDW                  [H]  14.7 %                      11.5-14.6   Platelet Count            200.0 K/uL                  150.0-400.0   Neutrophil %              43.6 %                      43.0-77.0   Lymphocyte %              41.9 %                      12.0-46.0   Monocyte %                10.9 %                      3.0-12.0   Eosinophils%              3.2 %                       0.0-5.0   Basophils %               0.4 %                       0.0-3.0   Neutrophill Absolute      2.2 K/uL                    1.4-7.7   Lymphocyte Absolute       2.1 K/uL                    0.7-4.0   Monocyte Absolute         0.6 K/uL                    0.1-1.0  Eosinophils, Absolute                             0.2 K/uL                    0.0-0.7   Basophils Absolute        0.0 K/uL  0.0-0.1  Tests: (4) Lipid Panel (LIPID)   Cholesterol               169 mg/dL                   1-601     ATP III Classification            Desirable:  < 200 mg/dL                    Borderline High:  200 - 239 mg/dL               High:  > = 240 mg/dL   Triglycerides             98.0 mg/dL                  0.9-323.5     Normal:  <150 mg/dL     Borderline High:  573 - 199 mg/dL   HDL                        78.70 mg/dL                 >22.02   VLDL Cholesterol          19.6 mg/dL                  5.4-27.0   LDL Cholesterol           71 mg/dL                    6-23  CHO/HDL Ratio:  CHD Risk                             2                    Men          Women     1/2 Average Risk     3.4          3.3     Average Risk          5.0          4.4     2X Average Risk          9.6          7.1     3X Average Risk          15.0          11.0                           Tests: (5) Hepatic/Liver Function Panel (HEPATIC)   Total Bilirubin           0.5 mg/dL                   7.6-2.8   Direct Bilirubin          0.1 mg/dL                   3.1-5.1   Alkaline Phosphatase      76 U/L                      39-117   AST  26 U/L                      0-37   ALT                       18 U/L                      0-35   Total Protein             6.7 g/dL                    1.6-1.0   Albumin                   3.5 g/dL                    9.6-0.4  Tests: (6) IBC Panel (IBC)   Iron                      52 ug/dL                    54-098   Transferrin               313.5 mg/dL                 119.1-478.2   Iron Saturation      [L]  11.8 %                      20.0-50.0  Tests: (7) B12 + Folate Panel (B12/FOL)   Vitamin B12               542 pg/mL                   211-911   Folate                    16.2 ng/mLPrescriptions: METHADONE HCL 10 MG  TABS (METHADONE HCL) 3 by mouth three times a day To be filled on or after 01/05/2011  #270 x 0   Entered by:   [name] [name], CMA   Authorized by:   Jacques Navy MD   Signed by:   [name] [name], CMA on 11/06/2010   Method used:   Print then Give to Patient   RxID:   [id number] METHADONE HCL 10 MG  TABS (METHADONE HCL) 3 by mouth three times a day To be filled on or after 12/07/2010  #270 x 0   Entered by:   [name] [name], CMA   Authorized by:   Jacques Navy MD   Signed by:   [name] [name], CMA on 11/06/2010   Method used:    Print then Give to Patient   RxID:   [id number] METHADONE HCL 10 MG  TABS (METHADONE HCL) 3 by mouth three times a day To be filled on or after 11/06/2010  #270 x 0   Entered by:   [name] [name], CMA   Authorized by:   Jacques Navy MD   Signed by:   [name] [name], CMA on 11/06/2010   Method used:   Print then Give to Patient   RxID:   [id number]    Orders Added: 1)  Radiology Referral [Radiology] 2)  TLB-BMP (Basic Metabolic Panel-BMET) [80048-METABOL] 3)  TLB-TSH (Thyroid Stimulating Hormone) [84443-TSH] 4)  TLB-CBC Platelet - w/Differential [85025-CBCD] 5)  TLB-Lipid Panel [80061-LIPID] 6)  TLB-Hepatic/Liver Function Pnl [80076-HEPATIC] 7)  TLB-IBC Pnl (Iron/FE;Transferrin) [83550-IBC] 8)  TLB-B12 + Folate Pnl [82746_82607-B12/FOL] 9)  Est. Patient Level IV [16109]

## 2010-11-27 NOTE — Progress Notes (Signed)
Summary: Mammo referral  Phone Note Outgoing Call   Summary of Call: Dr Debby Bud, I called the breast ctr to schedule pt appt they said pt was seen in 2010  and is due for reg mammogram, does pt need reg mammogram or diagnostic, they dont know anything about h/o breast cancer.  Thanks Initial call taken by: Dagoberto Reef,  November 07, 2010 9:58 AM  Follow-up for Phone Call        she has a history of breast cancer. They should do whatever they normally do for breast cancer survivors. Follow-up by: Jacques Navy MD,  November 07, 2010 10:56 AM

## 2010-11-27 NOTE — Letter (Signed)
Summary: New Patient letter  [facility]  [address] [place], [place] [address]   Phone: [phone]  Fax: [phone]       11/05/2010 MRN: [id number]  [name] [name] [address] [place], [place]  [address]  Dear Ms. Kemper Durie,  Welcome to the Gastroenterology Division at [facility].    You are scheduled to see Dr.  Russella Dar on 12-16-10 at 11:00a.m. on the 3rd floor at [facility], [address].  We ask that you try to arrive at our office 15 minutes prior to your appointment time to allow for check-in.  We would like you to complete the enclosed self-administered evaluation form prior to your visit and bring it with you on the day of your appointment.  We will review it with you.  Also, please bring a complete list of all your medications or, if you prefer, bring the medication bottles and we will list them.  Please bring your insurance card so that we may make a copy of it.  If your insurance requires a referral to see a specialist, please bring your referral form from your primary care physician.  Co-payments are due at the time of your visit and may be paid by cash, check or credit card.     Your office visit will consist of a consult with your physician (includes a physical exam), any laboratory testing he/she may order, scheduling of any necessary diagnostic testing (e.g. x-ray, ultrasound, CT-scan), and scheduling of a procedure (e.g. Endoscopy, Colonoscopy) if required.  Please allow enough time on your schedule to allow for any/all of these possibilities.    If you cannot keep your appointment, please call [phone] to cancel or reschedule prior to your appointment date.  This allows [place] the opportunity to schedule an appointment for another patient in need of care.  If you do not cancel or reschedule by 5 p.m. the business day prior to your appointment date, you will be charged a $50.00 late cancellation/no-show fee.    Thank you for choosing  [facility] for your medical needs.  We appreciate the opportunity to care for you.  Please visit [place] at our website  to learn more about our practice.                     Sincerely,                                                             The Gastroenterology Division

## 2010-11-28 ENCOUNTER — Telehealth: Payer: Self-pay | Admitting: Internal Medicine

## 2010-12-11 NOTE — Progress Notes (Signed)
Summary: RESULTS  Phone Note Call from Patient Call back at Home Phone [phone]   Summary of Call: Pt left vm - wants lab results. They were included in office visit notes and pt says she does not know how to read them. Will call pt to explain.  Initial call taken by: Lamar Sprinkles, CMA,  November 28, 2010 3:28 PM  Follow-up for Phone Call        Patient called and stated she is missing the part of the ov with her labs results. After speaking with the nurse I informed patient that I will send a copy of her labs by mail today. Follow-up by: Daphane Shepherd,  December 02, 2010 10:33 AM

## 2010-12-16 ENCOUNTER — Encounter: Payer: Self-pay | Admitting: Gastroenterology

## 2010-12-16 ENCOUNTER — Ambulatory Visit (INDEPENDENT_AMBULATORY_CARE_PROVIDER_SITE_OTHER): Payer: Medicare Other | Admitting: Gastroenterology

## 2010-12-16 DIAGNOSIS — Z86010 Personal history of colonic polyps: Secondary | ICD-10-CM

## 2010-12-16 DIAGNOSIS — K219 Gastro-esophageal reflux disease without esophagitis: Secondary | ICD-10-CM

## 2010-12-17 NOTE — Procedures (Signed)
Summary: EGD and biopsy   EGD  Procedure date:  10/11/2003  Findings:      Findings: Gastritis  Location: [facility]   Patient Name: [name] [name], [name] [name] MRN: [id number] Procedure Procedures: Panendoscopy (EGD) CPT: 43235.    with biopsy(s)/brushing(s). CPT: [id number].    with [facility] dilation CPT: [phone] Personnel: Endoscopist: Venita Lick. Russella Dar, MD, Clementeen Graham.  Referred By: Rosalyn Gess. Norins, MD.  Exam Location: Exam performed in Endoscopy Suite. Inpatient-ward  Patient Consent: Procedure, Alternatives, Risks and Benefits discussed, consent obtained, from patient.  Indications Symptoms: Dysphagia. Nausea. Vomiting.  History  Current Medications: Patient is not currently taking Coumadin.  Pre-Exam Physical: Performed Oct 11, 2003  Entire physical exam was normal.  Exam Exam Info: Maximum depth of insertion Duodenum, intended Duodenum. Vocal cords not visualized. Gastric retroflexion performed. ASA Classification: II. Tolerance: excellent.  Sedation Meds: Patient assessed and found to be appropriate for moderate (conscious) sedation. Fentanyl 62.5 mcg. given IV. Versed 7 mg. given IV. Cetacaine Spray 2 sprays given aerosolized.  Monitoring: BP and pulse monitoring done. Oximetry used. Supplemental O2 given  Findings Normal: Proximal Esophagus to Distal Esophagus.  - Dilation: Distal Esophagus. for dysphagia without stricture. Maloney dilator used, Diameter: 16 mm, Minimal Resistance, No Heme present on extraction. Maloney dilator used, Diameter: 17 mm, Minimal Resistance, No Heme present on extraction. Patient tolerance excellent. Outcome: successful. Comments: mild focal resistance with both dilators at 40cm suggesting a subtle stricture or increased LES pressure.  Normal: Cardia to Body.  Normal: Pyloric Sphincter to Duodenal 2nd Portion.  MUCOSAL ABNORMALITY: Antrum. Erythematous mucosa. RUT done, results pending. ICD9: Gastritis, Unspecified: 535.50. Comment: very  mild patchy erythema.   Assessment  Diagnoses: 535.50: Gastritis, Unspecified.   Events  Unplanned Intervention: No unplanned interventions were required.  Unplanned Events: There were no complications. Plans Medication(s): Continue current medications. PPI: Pantoprazole/Protonix 40 mg QAM,   Patient Education: Patient given standard instructions for: Mucosal Abnormality.  Disposition: After procedure patient sent to recovery. After recovery patient sent back to hospital.  Scheduling: Primary Care Provider, to Rosalyn Gess. Norins, MD,  Office Visit, to [employer]. Russella Dar, MD, [facility], assess response to dilation and consider manometry if dysphagia persists around Nov 01, 2003.    This report was created from the original endoscopy report, which was reviewed and signed by the above listed endoscopist.    cc: Illene Regulus, MD    RUT: Negative

## 2010-12-17 NOTE — Op Note (Signed)
Summary: Esophageal Manometry  NAME:  [name] [name], [name] [name]                           ACCOUNT NO.:  [id number]   MEDICAL RECORD NO.:  [id number]                   PATIENT TYPE:  AMB   LOCATION:  ENDO                                 FACILITY:  [facility]   PHYSICIAN:  Taziyah Iannuzzi T. Russella Dar, M.D. [facility]          DATE OF BIRTH:  06/17/1946   DATE OF PROCEDURE:  12/11/2003  DATE OF DISCHARGE:  12/11/2003                                 OPERATIVE REPORT   PROCEDURE:  Esophageal manometry   GASTROENTEROLOGIST:  Judie Petit T. Russella Dar, M.D.   INDICATIONS:  A 65 year old female with dysphagia.   RESULTS:  1. Upper esophageal sphincter:  Normal pressures and normal relaxation.  2. Esophageal body:  Normal tracing apparent on review; however, the     computer analysis read 50% simultaneous contractions.  This was not     confirmed by visual observation.  One channel in the distal port read 188     mmHg.  The other channels in the lower esophageal body were normal.  My     overall impression was that the pressures and peristaltic tracings     appeared normal.  3. Lower esophageal sphincter:  Elevated resting pressure at 60.4 mmHg,     normal residual pressure at 3.3 mmHg with 93% relaxation.   IMPRESSION:  Hypertensive lower esophageal sphincter with normal relaxation.  No other abnormalities confirmed.   RECOMMENDATION:  Discuss findings at return office visit.                                               Venita Lick. Russella Dar, M.D. [facility]    MTS/MEDQ  D:  12/21/2003  T:  12/21/2003  Job:  [id number]

## 2010-12-17 NOTE — Assessment & Plan Note (Signed)
Summary: Gastroenterology  [facility]   MR#:  [id number] Page #  NAME:  [name] [name], [name] [name]    OFFICE NO:  [id number]  DATE:  03/16/06  DOB:  2046/02/02  The patient is a 65 year old African American female who was referred by Dr. Jonny Ruiz.  I have seen her previously with a history of gastroesophageal reflux disease and dysphagia.  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  She has been on Protonix on a daily basis and was recently increased to b.i.d. last week by Dr. Jonny Ruiz.  She has had worsening solid food dysphagia over the past 2 months.  No odynophagia.  CBC from Mar 12, 2006, showed a hemoglobin of 11.9 with a normal MCV at 86.7.  Since I last saw her, she has had a screening colonoscopy by Dr. Rodena Piety at [facility] in [facility].  She had a tubular adenoma with "mild dysplasia" noted.  A 2-year followup was recommended.  She states that she does not want to return to [facility] for ongoing gastrointestinal care.    CURRENT MEDICATIONS:  Listed on the chart, updated, and reviewed.    MEDICATION ALLERGIES:  Darvocet.    PHYSICAL EXAMINATION:  In no acute distress.  Weight 164.6 pounds, blood pressure 100/80, pulse 88 and regular.  HEENT exam:  Anicteric sclerae, oropharynx clear.  Chest:  Clear to auscultation bilaterally.  Cardiac:  Regular rate and rhythm without murmurs.  Abdomen is soft, nontender, and nondistended.  Normoactive bowel sounds.  No palpable organomegaly, masses, or hernias.    ASSESSMENT AND PLAN:   1.  Gastroesophageal reflux disease and dysphagia, rule out peptic stricture, continue Protonix 40 mg p.o. b.i.d. along with standard antireflux measures.  Risks, benefits, and alternatives to upper endoscopy with possible biopsy and possible dilation discussed with the patient, and she consents to proceed.  This will be scheduled electively.   2.  Mild normocytic anemia.  Obtain iron,  iron binding, ferritin, B12, and folate today.   3.  History of an adenomatous polyp with "mild dysplasia."  Will obtain records from Dr. Noe Gens and determine appropriate followup interval for colonoscopy.  I suspect this will be between 2-5 years pending the pathology report.      Venita Lick. Russella Dar, M.D., F.A.C.G.  [id number] cc:  Oliver Barre, M.D.  D:  03/16/06; T:  ; Job [phone]

## 2010-12-17 NOTE — Procedures (Signed)
Summary: EGD   EGD  Procedure date:  04/13/2006  Findings:      Findings: Normal  Location: [facility]   Patient Name: [name] [name] MRN: [id number] Procedure Procedures: Panendoscopy (EGD) CPT: 43235.    with esophageal dilation. CPT: G9296129.  Personnel: Endoscopist: Venita Lick. Russella Dar, MD, Clementeen Graham.  Exam Location: Exam performed in Outpatient Clinic. Outpatient  Patient Consent: Procedure, Alternatives, Risks and Benefits discussed, consent obtained, from patient. Consent was obtained by the RN.  Indications Symptoms: Dysphagia. Reflux symptoms  History  Current Medications: Patient is not currently taking Coumadin.  Pre-Exam Physical: Performed Apr 13, 2006  Cardio-pulmonary exam, HEENT exam, Abdominal exam, Mental status exam WNL.  Comments: Pt. history reviewed/updated, physical exam performed prior to initiation of sedation?Yes Exam Exam Info: Maximum depth of insertion Duodenum, intended Duodenum. Vocal cords not visualized. Gastric retroflexion performed. ASA Classification: II. Tolerance: excellent.  Sedation Meds: Patient assessed and found to be appropriate for moderate (conscious) sedation. Fentanyl 50 mcg. given IV. Versed 7 mg. given IV. Cetacaine Spray 2 sprays given aerosolized.  Monitoring: BP and pulse monitoring done. Oximetry used. Supplemental O2 given  Findings Normal: Distal Esophagus to Cardia.  - Dilation: Distal Esophagus. for dysphagia without stricture. Savary dilator used, Diameter: 17 mm, Minimal Resistance, No Heme present on extraction. Patient tolerance excellent. Outcome: successful.  Normal: Fundus to Duodenal 2nd Portion.   Assessment Normal examination.  Events  Unplanned Intervention: No unplanned interventions were required.  Unplanned Events: There were no complications. Plans Instructions: Nothing to eat or drink for 1 hour.  Clear or full liquids: 1 hour.  Medication(s): PPI: QAM, for indefinitely.    Patient Education: Patient given standard instructions for: Reflux.  Disposition: After procedure patient sent to recovery. After recovery patient sent home.  Scheduling: Office Visit, to [employer]. Russella Dar, MD, [facility], prn    This report was created from the original endoscopy report, which was reviewed and signed by the above listed endoscopist.    cc: Rosalyn Gess. Norins, MD

## 2010-12-18 ENCOUNTER — Other Ambulatory Visit: Payer: Self-pay | Admitting: Obstetrics and Gynecology

## 2010-12-23 ENCOUNTER — Other Ambulatory Visit (AMBULATORY_SURGERY_CENTER): Payer: Medicare Other | Admitting: Gastroenterology

## 2010-12-23 ENCOUNTER — Encounter: Payer: Self-pay | Admitting: Gastroenterology

## 2010-12-23 DIAGNOSIS — Z860100 Personal history of colon polyps, unspecified: Secondary | ICD-10-CM

## 2010-12-23 DIAGNOSIS — Z1211 Encounter for screening for malignant neoplasm of colon: Secondary | ICD-10-CM

## 2010-12-23 LAB — HM COLONOSCOPY

## 2010-12-23 NOTE — Procedures (Signed)
Summary: Colonoscopy/Lenny Harrel Lemon MD  Colonoscopy/Lenny Harrel Lemon MD   Imported By: Sherian Rein 12/17/2010 10:35:15  _____________________________________________________________________  External Attachment:    Type:   Image     Comment:   External Document

## 2010-12-23 NOTE — Letter (Signed)
Summary: [facility]  [facility]   Imported By: Sherian Rein 12/17/2010 10:32:41  _____________________________________________________________________  External Attachment:    Type:   Image     Comment:   External Document

## 2010-12-23 NOTE — Letter (Signed)
Summary: [facility] Instructions  [facility]  [address] [place], [place] [address]   Phone: [phone]  Fax: [phone]       [name] [name]    Feb 05, 1946    MRN: [id number]        Procedure Day /Date: Tuesday February 28th 2012     Arrival Time: 7:30am      Procedure Time: 8:30am     Location of Procedure:                    _x _  [facility] (4th Floor)                        PREPARATION FOR COLONOSCOPY WITH MOVIPREP   Starting 5 days prior to your procedure 12/18/10 do not eat nuts, seeds, popcorn, corn, beans, peas,  salads, or any raw vegetables.  Do not take any fiber supplements (e.g. Metamucil, Citrucel, and Benefiber).  THE DAY BEFORE YOUR PROCEDURE         DATE: 12/22/10  DAY: Monday  1.  Drink clear liquids the entire day-NO SOLID FOOD  2.  Do not drink anything colored red or purple.  Avoid juices with pulp.  No orange juice.  3.  Drink at least 64 oz. (8 glasses) of fluid/clear liquids during the day to prevent dehydration and help the prep work efficiently.  CLEAR LIQUIDS INCLUDE: Water Jello Ice Popsicles Tea (sugar ok, no milk/cream) Powdered fruit flavored drinks Coffee (sugar ok, no milk/cream) Gatorade Juice: apple, white grape, white cranberry  Lemonade Clear bullion, consomm, broth Carbonated beverages (any kind) Strained chicken noodle soup Hard Candy                             4.  In the morning, mix first dose of MoviPrep solution:    Empty 1 Pouch A and 1 Pouch B into the disposable container    Add lukewarm drinking water to the top line of the container. Mix to dissolve    Refrigerate (mixed solution should be used within 24 hrs)  5.  Begin drinking the prep at 5:00 p.m. The MoviPrep container is divided by 4 marks.   Every 15 minutes drink the solution down to the next mark (approximately 8 oz) until the full liter is complete.   6.  Follow completed prep with 16 oz of clear liquid of your choice  (Nothing red or purple).  Continue to drink clear liquids until bedtime.  7.  Before going to bed, mix second dose of MoviPrep solution:    Empty 1 Pouch A and 1 Pouch B into the disposable container    Add lukewarm drinking water to the top line of the container. Mix to dissolve    Refrigerate  THE DAY OF YOUR PROCEDURE      DATE: 12/23/10 DAY: Tuesday  Beginning at 3:30 a.m. (5 hours before procedure):         1. Every 15 minutes, drink the solution down to the next mark (approx 8 oz) until the full liter is complete.  2. Follow completed prep with 16 oz. of clear liquid of your choice.    3. You may drink clear liquids until 6:30am (2 HOURS BEFORE PROCEDURE).   MEDICATION INSTRUCTIONS  Unless otherwise instructed, you should take regular prescription medications with a small sip of water   as early as possible the morning of  your procedure.    STOP FISH OIL AND ASPIRIN 5 DAYS BEFORE PROCEDURE.         OTHER INSTRUCTIONS  You will need a responsible adult at least 67 years of age to accompany you and drive you home.   This person must remain in the waiting room during your procedure.  Wear loose fitting clothing that is easily removed.  Leave jewelry and other valuables at home.  However, you may wish to bring a book to read or  an iPod/MP3 player to listen to music as you wait for your procedure to start.  Remove all body piercing jewelry and leave at home.  Total time from sign-in until discharge is approximately 2-3 hours.  You should go home directly after your procedure and rest.  You can resume normal activities the  day after your procedure.  The day of your procedure you should not:   Drive   Make legal decisions   Operate machinery   Drink alcohol   Return to work  You will receive specific instructions about eating, activities and medications before you leave.    The above instructions have been reviewed and explained to me by    _______________________    I fully understand and can verbalize these instructions _____________________________ Date _________

## 2010-12-23 NOTE — Assessment & Plan Note (Signed)
[facility]   [facility] W/PT//MEDICARE/MEDLIST//CX POLICY ADVISED   History of Present Illness Visit Type: Initial Consult Primary GI MD: Elie Goody MD [facility] Primary Provider: Illene Regulus, MD Requesting Provider: Illene Regulus, MD Chief Complaint: episodes of nausea, trouble swallowing, loss of appetite and reflux since Thanksgiving. History of Present Illness:    This is a 65 year old female relates worsening problems with acid reflux, belching and solid food dysphagia intermittently since Thanksgiving. she began ranitidine twice daily and her symptoms have substantially improved but have not fully abated. She underwent upper endoscopy pounds which was a normal examination. An  empiric dilation was performed. An esophageal manometry was performed in 2005 that revealed a hypertensive lower esophageal sphincter.  She underwent colonoscopy in February 2006 by Dr. Noe Gens showing an adenomatous colon polyp and internal hemorrhoids. She relates she had bleeding following colonoscopy, and  in addition to the polypectomy, she had rubber band ligation and infrared coagulation of internal hemorrhoids.   GI Review of Systems    Reports acid reflux, belching, dysphagia with solids, loss of appetite, and  nausea.      Denies abdominal pain, bloating, chest pain, dysphagia with liquids, heartburn, vomiting, vomiting blood, weight loss, and  weight gain.      Reports hemorrhoids.     Denies anal fissure, black tarry stools, change in bowel habit, constipation, diarrhea, diverticulosis, fecal incontinence, heme positive stool, irritable bowel syndrome, jaundice, light color stool, liver problems, rectal bleeding, and  rectal pain.   Current Medications (verified): 1)  Methadone Hcl 10 Mg  Tabs (Methadone Hcl) .... 3 By Mouth Three Times A Day To Be Filled On or After 01/05/2011 2)  Aspirin Low Dose 81 Mg  Tabs (Aspirin) .[name].. 1 Once Daily 3)  Estradiol 2 Mg  Tabs (Estradiol) .... 1/2 Once  Daily 4)  Methimazole 10 Mg  Tabs (Methimazole) .... Take 1 Tablet By Mouth Once A Day 5)  Hydrochlorothiazide 12.5 Mg  Tabs (Hydrochlorothiazide) .[name].. 1 Once Daily 6)  Vitamin D3 1000 Unit Tabs (Cholecalciferol) .... Take 1 Tablet By Mouth Once A Day 7)  Zolpidem Tartrate 10 Mg Tabs (Zolpidem Tartrate) .[name].. 1 By Mouth At Bedtime X 5 Nights Then Every Third Night. 8)  Meloxicam 15 Mg Tabs (Meloxicam) .[name].. 1 By Mouth Once Daily 9)  Ranitidine Hcl 150 Mg Caps (Ranitidine Hcl) .[name].. 1 By Mouth Two Times A Day 10)  Digestive Enzymes  Tabs (Digestive Enzymes) .[name].. 1 By Mouth Once Daily 11)  Acidophilus Probiotic Blend  Caps (Probiotic Product) .[name].. 1 By Mouth Once Daily 12)  Fish Oil  Oil (Fish Oil) .[name].. 1 Tablespoon Once Daily  Allergies (verified): 1)  Darvocet-N 100  Past History:  Past Medical History: sle per dr deveshwar ? fibromyalgia Breast cancer, hx of Hypertension chronic headaches DJD Anemia-iron deficiency - remote Depression Colonic polyps, hx of - adenomatous 11/2004 GERD Internal hemorrhoids Allergic rhinitis Low back pain/ lumbar spinal stenosis Esophageal Stricture  Past Surgical History: Reviewed history from 03/30/2008 and no changes required. Appendectomy Hysterectomy Lumpectomy with a curative resection of malignancy Excision of pituitary tumor via sphenoid approach  Breast Biopsy Bunionectomy  s/p right TMJ s/p left eye surgury  Family History: Reviewed history from 03/30/2008 and no changes required. no h/o pituitary dz mother with HTN, arrythmia, gout sister with thyroid disease, and DM son with HTN No FH of Colon Cancer:  Social History: Reviewed history from 05/10/2008 and no changes required. Divorced-after 26 years Never Smoked housing counselor/trained paralegal Alcohol use-yes  2 children: 1 son - ''65; 1 daughter -'12; 3 grandchildren  Review of Systems       The patient complains of arthritis/joint pain, back pain, sleeping  problems, and swelling of feet/legs.         The pertinent positives and negatives are noted as above and in the HPI. All other ROS were reviewed and were negative.    Vital Signs:  Patient profile:   65 year old female Height:      68 inches Weight:      178 pounds BMI:     27.16 Pulse rate:   72 / minute Pulse rhythm:   regular BP sitting:   142 / 84  (left arm)  Vitals Entered By: Milford Cage NCMA (December 16, 2010 11:30 AM)  Physical Exam  General:  Well developed, well nourished, no acute distress. Head:  Normocephalic and atraumatic. Eyes:  PERRLA, no icterus. Ears:  Normal auditory acuity. Mouth:  No deformity or lesions, dentition normal. Neck:  Supple; no masses or thyromegaly. Lungs:  Clear throughout to auscultation. Heart:  Regular rate and rhythm; no murmurs, rubs,  or bruits. Abdomen:  Soft, nontender and nondistended. No masses, hepatosplenomegaly or hernias noted. Normal bowel sounds. Rectal:  deferred until time of colonoscopy.   Msk:  Symmetrical with no gross deformities. Normal posture. Pulses:  Normal pulses noted. Extremities:  No clubbing, cyanosis, edema or deformities noted. Neurologic:  Alert and  oriented x4;  grossly normal neurologically. Cervical Nodes:  No significant cervical adenopathy. Inguinal Nodes:  No significant inguinal adenopathy. Psych:  Alert and cooperative. Normal mood and affect.  Impression & Recommendations:  Problem # 1:  GERD (ICD-530.81)  Discontinue ranitidine and begin pantoprazole 40 mg daily along with standard antireflux measures. If her reflux symptoms or dysphagia persists consider proceeding with upper endoscopy , barium esophagram and consider repeat esophageal manometry.  Problem # 2:  COLONIC POLYPS, HX OF (ICD-V12.72)  Personal history of adenomatous colon polyps in February 2006. She is overdue for a five-year surveillance colonoscopy. Given her bleeding following her last colonoscopy, which may have been  attributed to hemorrhoidal therapy, we will plan to hold aspirin and fish oil for 5 days prior to the procedure. The risks, benefits and alternatives to colonoscopy with possible biopsy and possible polypectomy were discussed with the patient and they consent to proceed. The procedure will be scheduled electively. Orders: Colonoscopy (Colon)  Patient Instructions: 1)  Stop ranitidine and start pantoprazole 40mg  one tablet by mouth once daily. Prescription has been sent to your pharmacy.  2)  Avoid foods high in acid content ( tomatoes, citrus juices, spicy foods) . Avoid eating within 3 to 4 hours of lying down or before exercising. Do not over eat; try smaller more frequent meals. Elevate head of bed four inches when sleeping.  3)  Colonoscopy brochure given.  4)  Stop Fish oil supplement and Aspirin 5 days before your procedure.  5)  Copy sent to : Illene Regulus, MD 6)  The medication list was reviewed and reconciled.  All changed / newly prescribed medications were explained.  A complete medication list was provided to the patient / caregiver.  Prescriptions: MOVIPREP 100 GM  SOLR (PEG-KCL-NACL-NASULF-NA ASC-C) As per prep instructions.  #1 x 0   Entered by:   Christie Nottingham CMA (AAMA)   Authorized by:   Meryl Dare MD [facility]   Signed by:   Christie Nottingham CMA (AAMA) on 12/16/2010   Method used:  Electronically to        [facility]* (retail)       [address]       [place], [place]  [address]       Ph: [phone]       Fax: [phone]   RxID:   [id number] PANTOPRAZOLE SODIUM 40 MG TBEC (PANTOPRAZOLE SODIUM) one tablet by mouth once daily  #30 x 11   Entered by:   Christie Nottingham CMA (AAMA)   Authorized by:   Meryl Dare MD [facility]   Signed by:   Christie Nottingham CMA (AAMA) on 12/16/2010   Method used:   Electronically to        [facility]* (retail)       [address]       [place], [place]  [address]        Ph: [phone]       Fax: [phone]   RxID:   [id number]

## 2011-01-01 NOTE — Procedures (Addendum)
Summary: Colonoscopy  Patient: [name] Note: All result statuses are Final unless otherwise noted.  Tests: (1) Colonoscopy (COL)   COL Colonoscopy           DONE     [facility]     [address]     [place], [place]  [address]          COLONOSCOPY PROCEDURE REPORT          PATIENT:  [name] [name], [name] [name]  MR#:  [id number]     BIRTHDATE:  [birth year], 64 yrs. old  GENDER:  female     ENDOSCOPIST:  Judie Petit T. Russella Dar, MD, [facility]          PROCEDURE DATE:  12/23/2010     PROCEDURE:  Colonoscopy 19147     ASA CLASS:  Class II     INDICATIONS:  1) surveillance and high-risk screening  2) history     of pre-cancerous (adenomatous) colon polyps: 11/2004     MEDICATIONS:   Fentanyl 75 mcg IV, Versed 7 mg IV, Benadryl 25 mg     IV     DESCRIPTION OF PROCEDURE:   After the risks benefits and     alternatives of the procedure were thoroughly explained, informed     consent was obtained.  Digital rectal exam was performed and     revealed no abnormalities.   The LB [id number]-[id number] endoscope     was introduced through the anus and advanced to the cecum, which     was identified by both the appendix and ileocecal valve, without     limitations.  The quality of the prep was excellent, using     MoviPrep.  The instrument was then slowly withdrawn as the colon     was fully examined.     <<PROCEDUREIMAGES>>     FINDINGS:  A normal appearing cecum, ileocecal valve, and     appendiceal orifice were identified. The ascending, hepatic     flexure, transverse, splenic flexure, descending, sigmoid colon,     and rectum appeared unremarkable. Retroflexed views in the rectum     revealed no abnormalities. The time to cecum =  3.75  minutes. The     scope was then withdrawn (time =  10  min) from the patient and     the procedure completed.          COMPLICATIONS:  None          ENDOSCOPIC IMPRESSION:     1) Normal colon          RECOMMENDATIONS:     1) Repeat colonoscopy in 5  years.          Venita Lick. Russella Dar, MD, Clementeen Graham          CC:  Jacques Navy, MD          n.     Rosalie DoctorVenita Lick. Doriann Zuch at 12/23/2010 09:09 AM          Donnal Debar, [phone]  Note: An exclamation mark (!) indicates a result that was not dispersed into the flowsheet. Document Creation Date: 12/23/2010 9:10 AM _______________________________________________________________________  (1) Order result status: Final Collection or observation date-time: 12/23/2010 08:58 Requested date-time:  Receipt date-time:  Reported date-time:  Referring Physician:   Ordering Physician: Claudette Head [id number]) Specimen Source:  Source: Launa Grill Order Number: [id number] Lab site:   Appended Document: Colonoscopy    Clinical Lists Changes  Observations: Added new observation of  EGD: 11/2015 (12/23/2010 12:43)

## 2011-01-12 LAB — BASIC METABOLIC PANEL
CO2: 27 mEq/L (ref 19–32)
Creatinine, Ser: 0.83 mg/dL (ref 0.4–1.2)
Glucose, Bld: 88 mg/dL (ref 70–99)
Potassium: 3.4 mEq/L — ABNORMAL LOW (ref 3.5–5.1)
Sodium: 137 mEq/L (ref 135–145)

## 2011-01-12 LAB — EYE CULTURE

## 2011-01-12 LAB — CBC: Platelets: 209 10*3/uL (ref 150–400)

## 2011-02-03 ENCOUNTER — Encounter: Payer: Self-pay | Admitting: Internal Medicine

## 2011-02-03 ENCOUNTER — Ambulatory Visit (INDEPENDENT_AMBULATORY_CARE_PROVIDER_SITE_OTHER)
Admission: RE | Admit: 2011-02-03 | Discharge: 2011-02-03 | Disposition: A | Discharge status: Home / Self Care | Payer: Medicare Other | Source: Ambulatory Visit | Attending: Internal Medicine | Admitting: Internal Medicine

## 2011-02-03 ENCOUNTER — Ambulatory Visit (INDEPENDENT_AMBULATORY_CARE_PROVIDER_SITE_OTHER): Payer: Medicare Other | Admitting: Internal Medicine

## 2011-02-03 VITALS — BP 152/80 | HR 83 | Temp 98.1°F | Wt 180.0 lb

## 2011-02-03 DIAGNOSIS — M79603 Pain in arm, unspecified: Secondary | ICD-10-CM

## 2011-02-03 DIAGNOSIS — M79609 Pain in unspecified limb: Secondary | ICD-10-CM

## 2011-02-03 MED ORDER — METHADONE HCL 10 MG PO TABS: 30.0000 mg | ORAL_TABLET | Freq: Three times a day (TID) | ORAL | Status: DC

## 2011-02-03 NOTE — Progress Notes (Signed)
  Subjective:    Patient ID: [name] [name], female    DOB: 1945-12-30, 65 y.o.   MRN: [id number]  HPI  Mrs. Provencio presents for a progressive problem of pain in bilateral UE from mid-biceps area to the wrist. She denies any association with activity, it is not positional and can happen at any time. Duration is for up to an hour. The nature of the pain is a deep aching pain that is very uncomfortable and not relieved by methadone or meloxicam. She denies any paresthesia or weakness in the hands other than her baseline weakness due to arthritis.     Review of Systems  Constitutional: Negative.   HENT: Negative.   Eyes: Negative.   Respiratory: Negative.   Cardiovascular: Negative.   Gastrointestinal: Negative.   Genitourinary: Negative.   Musculoskeletal: Positive for back pain and arthralgias. Negative for gait problem.  Skin: Positive for color change.  Neurological: Negative.  Negative for weakness and numbness.  Hematological: Negative.        Objective:   Physical Exam  Constitutional: She is oriented to person, place, and time. She appears well-developed and well-nourished. No distress.  HENT:  Head: Normocephalic and atraumatic.  Eyes: Conjunctivae and EOM are normal. Pupils are equal, round, and reactive to light.       Arcus senilis  Neck: Normal range of motion. Neck supple.  Cardiovascular: Normal rate and regular rhythm.   Pulmonary/Chest: Effort normal and breath sounds normal.  Abdominal: Soft. Bowel sounds are normal.  Musculoskeletal: Normal range of motion. She exhibits no edema.  Neurological: She is alert and oriented to person, place, and time.  Skin: Skin is warm.          Assessment & Plan:  1. Arm pain - all joints UE are normal in ROM, without synovial thickening, erythema or tenderness. Grip strenght and proximal strength are normal. DTRs are normal. Suspect central cause, i.e. DDD that is minor.  Plan - c-spine films: if loss of disk height will  treat with gabapentin. If normal disk height will consider changing anti-inflammatory drug.   2. chronic pain syndrome - renewed methadone  3. Health maintenance - she declines  tDap and pneumo-vax today. She reports she is current with colonoscopy, mammogram and gyn exam.

## 2011-02-04 ENCOUNTER — Telehealth: Payer: Self-pay | Admitting: *Deleted

## 2011-02-04 DIAGNOSIS — M792 Neuralgia and neuritis, unspecified: Secondary | ICD-10-CM

## 2011-02-04 NOTE — Telephone Encounter (Signed)
Patient requesting results of XRAY 

## 2011-02-04 NOTE — Telephone Encounter (Signed)
Disc space narrowing C4-7 with foraminal narrowing. This means that the arm pain may be coming from the cervical spine. In order to more and to plan any treatment will need an MRI. MRI order place - please do your thing, i.e. Print requisition and give to PCCs. Thanks

## 2011-02-05 NOTE — Telephone Encounter (Signed)
Patient informed & PCC's notified

## 2011-02-05 NOTE — Telephone Encounter (Signed)
Left mess to call office back.   

## 2011-02-06 ENCOUNTER — Telehealth: Payer: Self-pay | Admitting: *Deleted

## 2011-02-06 NOTE — Telephone Encounter (Signed)
Pt is in a lot of pain still, wants MRI asap (will check w/PCC's) She also will need rx to help her relax during test.

## 2011-02-08 NOTE — Telephone Encounter (Signed)
Diazepam 5mg  20 min before study, # 2

## 2011-02-09 MED ORDER — DIAZEPAM 5 MG PO TABS: 5.0000 mg | ORAL_TABLET | ORAL | Status: DC

## 2011-02-09 MED ORDER — ESTRADIOL 2 MG PO TABS: 1.0000 mg | ORAL_TABLET | Freq: Every day | ORAL | Status: DC

## 2011-02-09 NOTE — Telephone Encounter (Signed)
Pt aware.

## 2011-02-17 ENCOUNTER — Ambulatory Visit (HOSPITAL_COMMUNITY)
Admission: RE | Admit: 2011-02-17 | Discharge: 2011-02-17 | Disposition: A | Discharge status: Home / Self Care | Payer: Medicare Other | Source: Ambulatory Visit | Attending: Internal Medicine | Admitting: Internal Medicine

## 2011-02-17 DIAGNOSIS — M5020 Other cervical disc displacement, unspecified cervical region: Secondary | ICD-10-CM | POA: Insufficient documentation

## 2011-02-17 DIAGNOSIS — M542 Cervicalgia: Secondary | ICD-10-CM | POA: Insufficient documentation

## 2011-02-17 DIAGNOSIS — M5030 Other cervical disc degeneration, unspecified cervical region: Secondary | ICD-10-CM | POA: Insufficient documentation

## 2011-02-17 DIAGNOSIS — M792 Neuralgia and neuritis, unspecified: Secondary | ICD-10-CM

## 2011-02-17 DIAGNOSIS — M4802 Spinal stenosis, cervical region: Secondary | ICD-10-CM | POA: Insufficient documentation

## 2011-02-17 DIAGNOSIS — M79609 Pain in unspecified limb: Secondary | ICD-10-CM | POA: Insufficient documentation

## 2011-02-19 ENCOUNTER — Telehealth: Payer: Self-pay | Admitting: *Deleted

## 2011-02-19 DIAGNOSIS — M79609 Pain in unspecified limb: Secondary | ICD-10-CM

## 2011-02-19 NOTE — Telephone Encounter (Signed)
Patient requesting results of MRI.

## 2011-02-19 NOTE — Telephone Encounter (Signed)
MRI reveals multi-level spinal stenosis in the cervical spine. There is moderate to severe foraminal stenosis, nerve root crowding, at multiple levels including C4-5, C5-6 which the radiologist is calling stable in comparison to old studies. However, with progressive symptoms this is evidence enough to warrnat referral to NS. Order placed.

## 2011-02-20 NOTE — Telephone Encounter (Signed)
Patient informed. 

## 2011-03-02 ENCOUNTER — Telehealth: Payer: Self-pay | Admitting: *Deleted

## 2011-03-02 NOTE — Telephone Encounter (Signed)
May change pain regimen to MS contin 15mg  q 12. #60. Taper off methadone: go to two tabs tid x 3 days, 1 tab tid x 3 days, 1 tab bid x 3, then stop. She will concommittently take the MS Contin but she needs to be very careful about sedation.

## 2011-03-02 NOTE — Telephone Encounter (Signed)
Patient requesting RX to help with her pain. Says meds she is on currently have not helped. She is unable to see MD until next week and will be out of town Friday.

## 2011-03-03 MED ORDER — MORPHINE SULFATE CR 15 MG PO TB12: 15.0000 mg | ORAL_TABLET | Freq: Two times a day (BID) | ORAL | Status: DC

## 2011-03-03 NOTE — Telephone Encounter (Signed)
Patient informed, I also will include taper instructions in with MS Contin RX.

## 2011-03-10 NOTE — Assessment & Plan Note (Signed)
[name] [name]                             PULMONARY OFFICE NOTE   [name] [name], [name] [name]                        MRN:          [id number]  DATE:05/26/2007                            DOB:          22-May-1946    HISTORY OF PRESENT ILLNESS:  The patient is a 65 year old female whom I  have been asked to see for significant daytime sleepiness. The patient  states that for the last 4-6 months, she has had extreme inappropriate  daytime sleepiness with periods of inactivity and it has now gotten to  the point where she will doze at traffic lights and also will fall  asleep with conversations. At first, this was felt possibly to be due to  medications, but this has not improved after medication changes. The  patient has had a negative MRI in July of this year. She has an  appointment with a neurologist on August 29th to further look into this  as well. Of note, the patient has a history of a pituitary tumor with a  resection in 2002. To her knowledge, she has not been pan hypo pit. The  patient reports no recent head trauma or injury. The patient typically  gets to bed between 9:30 and 10:30 and gets up at 6 a.m. to start her  day. She is not rested upon arising. She does not have a bed partner and  therefore it is unknown if she snores or has pauses in her breathing  during her sleep. The patient denies any symptoms of restless leg  syndrome, nor has she had any leg jerks. During the day, the patient  works as a Psychiatrist and is very concerned about how this  affecting her job in terms of alertness and work efficiency. Again, she  had sleepiness with driving. Of note, her weight has been up and down  about 10 pounds over the last two years.   PAST MEDICAL HISTORY:  1. Hypertension.  2. History of breast cancer treated with lumpectomy.  3. History of chronic headaches.  4. History of pituitary tumor with resection in 2002.  5. Status post appendectomy  and hysterectomy.   CURRENT MEDICATIONS:  1. Aspirin 81 mg daily.  2. Estradiol 2 mg tablet one-half daily.  3. Methimazole 10 mg one and a half daily.  4. Hydrochlorothiazide 12.5 mg daily.  5. Methadone 10 mg one t.i.d.   The patient has AN INTOLERANCE TO DARVOCET.   SOCIAL HISTORY:  She is divorced and has children. She has never smoked.   FAMILY HISTORY:  Is noncontributory.   REVIEW OF SYSTEMS:  As per History of Present Illness. Also, see patient  intake form documented in the chart.   PHYSICAL EXAMINATION:  In general, she is an overweight female in no  acute distress. Blood pressure 112/70, pulse 86, temperature 99.5,  weight 163 pounds. O2 saturation on room air is 97%.  HEENT: Pupils equal, round and reactive to light and accommodation.  Extraocular muscles are intact. Nares: Shows turbinate hypertrophy with  deviated septum to the right. Oropharynx does show mild elongation of  the uvula and palate, but not overly impressive.  NECK: is supple without JVD or lymphadenopathy. There is no palpable  thyromegaly.  CHEST: Is totally clear.  CARDIAC: Reveals regular rate and rhythm. No murmurs, rubs or gallops.  ABDOMEN: Soft and nontender with good bowel sounds.  GENITAL, RECTAL, BREASTS: Was not done and not indicated.  LOWER EXTREMITIES: Shows trace pedal edema.  PULSES: Are intact distally.  NEUROLOGIC: She was alert and oriented and moves all four extremities.   IMPRESSION:  Hypersomnia of unknown etiology. It is really unclear  whether she has a sleep disorder nocturnally such as sleep apnea,  seizures or parasomnia. I would also wonder whether there is a  neurologic process going on here, or whether this could be related to a  hormonal influence with her history of pituitary tumor. It is unknown  whether she has ever been worked up for hormonal deficiencies.   PLAN:  1. Will check TSH again today.  2. Schedule for NPSG.  3. The patient will followup after the  above.     Barbaraann Share, MD,FCCP  Electronically Signed    [facility]  DD: 06/06/2007  DT: 06/06/2007  Job #: [id number]   cc:   Barbette Hair. Artist Pais, DO

## 2011-03-10 NOTE — Procedures (Signed)
NAME:  [name] [name], [name] [name] NO.:  [id number]   MEDICAL RECORD NO.:  [id number]          PATIENT TYPE:  OUT   LOCATION:  SLEEP CENTER                 FACILITY:  [facility]   PHYSICIAN:  Barbaraann Share, MD,FCCPDATE OF BIRTH:  29-Feb-1944   DATE OF STUDY:                            NOCTURNAL POLYSOMNOGRAM   REFERRING PHYSICIAN:  Barbaraann Share, MD,FCCP   INDICATION FOR STUDY:  Hypersomnia, unspecified 780.54.   EPWORTH SLEEPINESS SCORE:  24.   SLEEP ARCHITECTURE:  The patient had a total sleep time of 366 minutes  with no slow-wave sleep and minimal REM.  Sleep onset latency was 1.5  minutes and REM onset was 67 minutes all of which are fairly rapid.  Sleep efficiency was decreased at 84%.   RESPIRATORY DATA:  The patient was found to have 9 hypopneas and 1 apnea  for an apnea-hypopnea index of 1.6 events per hour.  The events occurred  primarily during REM and there was mild-to-moderate snoring noted  throughout.   OXYGEN DATA:  The patient had transient O2 desaturation as low as 88%  with her obstructive events.   CARDIAC DATA:  Rare PACs without clinically significant arrhythmias.   MOVEMENT-PARASOMNIA:  The patient was found to have no significant leg  jerks or abnormal behaviors or seizures.   IMPRESSIONS-RECOMMENDATIONS:  1. Small numbers of obstructive events which do not meet the apnea-      hypopnea index criteria for the obstructive sleep apnea syndrome.      The patient did not have any significant leg jerks, abnormal      behaviors, or evidence for nocturnal seizures on this study.      Clinical correlation is suggested for other etiologies for her      excessive daytime sleepiness.  2. Rare premature atrial contractions without clinically significant      arrhythmia.     Barbaraann Share, MD,FCCP  Diplomate, American Board of Sleep  Medicine  Electronically Signed    [facility]  D:  06/29/2007 09:00:18  T:  06/29/2007 11:91:47  Job:  [id number]
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
## 2011-03-13 NOTE — Op Note (Signed)
NAME:  [name] [name], [name] [name] NO.:  [id number]   MEDICAL RECORD NO.:  [id number]                   PATIENT TYPE:  OIB   LOCATION:  5725                                 FACILITY:  [facility]   PHYSICIAN:  Guadelupe Sabin, M.D.             DATE OF BIRTH:  1946-10-21   DATE OF PROCEDURE:  05/02/2004  DATE OF DISCHARGE:                                 OPERATIVE REPORT   PREOPERATIVE DIAGNOSIS:  Rhegmatogenous retinal detachment, left eye,  multiple defects.   POSTOPERATIVE DIAGNOSIS:  Rhegmatogenous retinal detachment, left eye,  multiple defects.   OPERATION PERFORMED:  Scleral buckling procedure, left eye, using solid  silicone implants, #277 and 240.  Cryoapplication.  Paracentesis.   SURGEON:  Guadelupe Sabin, M.D.   ASSISTANT:  Nurse.   ANESTHESIA:  General.   OPHTHALMOSCOPY:  As previously drawn.   DESCRIPTION OF PROCEDURE:  After the patient was prepped and draped, a lid  speculum was inserted in the left eye.  Lid traction sutures were placed in  the left upper and lower lids.  A peritomy was performed adjacent to the  limbus 360 degrees.  Subconjunctival tissue was cleaned and the rectus  muscles isolated with 4-0 silk traction sutures.  The sclera was inspected  and felt to be rather thin but in satisfactory thickness for lamellar  scleral dissection.  Localization was then carried out of the multiple  retinal holes and multiple areas of lattice retinal degeneration.  Direct  cryo applications were applied to each of the retinal holes and lattice.  Good cryo reaction could be seen.  It was then elected to perform lamellar  scleral dissection from the 12 to 2:30 position.  The bed measuring 9 mm in  width.  The patient had been given Diamox 500 mg intravenously to lower the  intraocular pressure.  A total of three 2-0, 4-0 green Mersilene and one 5-0  Dacron sutures were used to close the scleral flaps with 3-0 plain catgut  reinforcements over a  trimmed #277 solid silicone implant.  A #240 solid  silicone encircling band was placed about at the equator, tied with two  sutures at the 8 o'clock position.  Anchoring sutures of 5-0 white Dacron  were placed at the 8, 10 and 4 o'clock position to hold the encircling band  in place.  Indirect ophthalmoscopy confirmed good cryo reaction around the  tears.  It was then elected to close.  The encircling band was pulled up  slightly and the patient had a paracentesis performed with a Wheeler knife  at the 3 o'clock position.  The tension of the encircling band was adjusted  and it was elected to close. The drainage was not felt required in this  patient as there was minimal fluid and perforation might cause retinal  incarceration or another hole.  Tenons capsule was pulled forward in the  four quadrants and tied as  a separate layer.  The conjunctiva was then  pulled forward and closed with a running 6-0 chromic catgut suture.  Neosporin ophthalmic solution had been irrigated in the subtenon's space.  Maxitrol and atropine ointment were instilled in the conjunctival cul-de-sac  and a light patch and protector shield applied.  Duration of procedure 1 to  1-1/2 hours.  The patient tolerated the procedure well in general, left the  operating room for the recovery room on her left side for transfer later to  the 23 hour observation unit.                                              Guadelupe Sabin, M.D.   HNJ/MEDQ  D:  05/02/2004  T:  05/02/2004  Job:  [id number]

## 2011-03-13 NOTE — H&P (Signed)
NAMEMarland [name]  CHERIDAN, KIBLER                           ACCOUNT NO.:  [id number]   MEDICAL RECORD NO.:  [id number]                   PATIENT TYPE:  INP   LOCATION:  0357                                 [facility]ACILITY[facility]  [facility]   PHYSICIAN:  Rosalyn Gess. Norins, M.D. [facility]         DATE OF BIRTH:  Jan 18, 1946   DATE OF ADMISSION:  10/08/2003  DATE OF DISCHARGE:                                HISTORY & PHYSICAL   CHIEF COMPLAINT:  Nausea, vomiting, diarrhea.   HISTORY OF PRESENT ILLNESS:  [name] is a 65 year old African-  American woman who had the onset approximately six days ago of nausea and  vomiting.  Unable to keep down food or fluids and also developed diarrhea.  Because of her persistent symptoms, she was seen at [facility] October 06, 2003.  Evaluation at that time revealed normal CBC  with differential, normal CMET, normal amylase and unremarkable KUB.  She  was given IV fluids in the ER and was sent home with a prescription for  Phenergan.   The patient reports on getting home she was still unable to keep down food  or fluids and had persistent nausea and vomiting and diarrhea.  She was  unable to keep down Phenergan, subsequently presented to the office for  evaluation. She was found to be orthostatic.  She is now admitted for  prolonged IV therapy and IV Phenergan to control her symptoms for probable  viral gastroenteritis.   PAST MEDICAL HISTORY:   SURGICAL:  1. Appendectomy in 1969.  2. Hysterectomy in 1970.  3. Breast biopsies in 1984, 1986, and 1990 leading to a left lumpectomy for     malignancy with curative resection.  4. 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  5. History of pituitary tumor, sphenoid approach excision in 2003.  6. Bunionectomy in 2002.   MEDICAL ILLNESSES:  The patient had the usual childhood diseases.  She had  menarche at age 10.  She is a gravida 3, para 2 with 1 TAB.  History of  blood transfusion in 1968.  She has a history  of UTI.  History of anemia  that is remote.  History of breast cancer as noted.  History of headache  thought to be nonmigraine.  History of hypertension thought to be white-coat  hypertension and not on medical therapy.  History of knee pain with  aspiration of fluid, was positive for calcium oxalate crystals.  History of  depression treated medically and with counseling.  The patient had recently  been diagnosed with lupus in 2004.  The patient has had significant problems  with back pain thought to be secondary to an L5 bulging disk by MRI scan and  she has been referred to orthopedic surgery.  The patient also had an  abnormal-appearing bone on MRI and had bone biopsy with CT guidance which  was negative.  The patient had obstipation recently and  has been treated  with lactulose.   CURRENT MEDICATIONS:  1. Plaquenil 200 mg b.i.d.  2. Neurontin 300 mg t.i.d.  3. Bextra as needed for pain.  4. Lorcet as needed for pain.  5. Allegra 60 mg b.i.d.   FAMILY HISTORY:  Mother with hypertension and gout.  Father in good health.  History of hypertension in a son, diabetes in her daughter.  Prostate cancer  in her grandparents.  The patient has two siblings who are healthy.   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  Currently works as a Advertising account planner.  The  patient was married for 26 years and has been divorced for 10.                                               Rosalyn Gess Norins, M.D. [facility]    MEN/MEDQ  D:  10/08/2003  T:  10/08/2003  Job:  [id number]

## 2011-03-13 NOTE — H&P (Signed)
NAME:  [name] [name], [name] [name] NO.:  [id number]   MEDICAL RECORD NO.:  [id number]                   PATIENT TYPE:   LOCATION:                                       FACILITY:   PHYSICIAN:  Rosalyn Gess. Norins, M.D. [facility]         DATE OF BIRTH:  [birth year]   DATE OF ADMISSION:  10/08/2003  DATE OF DISCHARGE:                                HISTORY & PHYSICAL   DATE OF ADMISSION:  October 08, 2003   CHIEF COMPLAINT:  Nausea and vomiting.   HISTORY OF PRESENT ILLNESS:  [name] [name] is a 65 year old woman who is  followed for multiple medical problems in the office as an outpatient.  She  reports last Wednesday she developed abdominal discomfort with nausea and  vomiting.  This was persistent.  She was unable to keep down food or fluids.  She was seen at [facility] emergency department on Saturday, October 06, 2003.  Laboratory at that time was unremarkable including a normal amylase,  normal CBC with diff, normal comprehensive metabolic panel, normal KUB x-  ray.  She was given IV fluids and Phenergan.  She was discharged home.  Since returning home she has been unable to keep down oral antiemetics, she  has continued to have significant problems with nausea and vomiting and  diarrhea.  She has been unable to keep down food or fluids.  She presents to  the office today with these problems.  In the office she is orthostatic.  At  this point she is to be admitted to hospital for IV fluids, IV antiemetics  until stable.   PAST MEDICAL HISTORY:  1. Surgical:  Appendectomy in 1969, hysterectomy in 1970; breast biopsies in     1984, 1986, and 1990 which lead to a left breast lumpectomy for     malignancy with curative resection.  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  History of pituitary tumor with sphenoid excision     in 2003.  Bunionectomy prior to that in 2002.  2. Medical illnesses:  The patient had the usual childhood diseases,  menarche at age 1.  She is a gravida 3 para 2 with one TAB.  History of     blood transfusion in 1968.  History of urinary tract infections.  History     of anemia.  History of breast cancer as noted.  History of headaches over     the past 10 years thought to be non migraine.  History of hypertension.     History of significant knee pain and a history of calcium oxalate crystal     on aspiration.  History of depression in the past treated with both     medications and counseling therapy.  The patient was recently diagnosed     with lupus in 2004.  The patient has been followed for severe and  significant back pain with MRI evidence of herniated nucleus pulposus     with nerve impingement.  The patient has been treated chronically for     pain secondary to her back.   CURRENT MEDICATIONS:  1. Tapazole 10 mg daily.  2. OxyContin 20 mg b.i.d.  3. Etodolac 500 mg b.i.d.  4. Oxycodone 5/325 as needed for breakthrough pain.   FAMILY HISTORY:  Mother is 36; she has hypertension and history of gout.  Father is 34 with good health.  Family history is positive for hypertension  in her son, diabetes in a daughter, prostate cancer in a grandparent.  There  is no family history for colon cancer, ovarian cancer, lung cancer,  rheumatologic disease.   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  The  patient was married for 26 years and has been divorced for 10 years.  She  has two grown children.  She has three grandchildren.  She currently lives  alone.  The patient is a nonsmoker.  She averages maybe 2-3 ounces of  alcohol per month at the most.  She does use occasional herbal remedies.  The patient is up-to-date with GYN health.   REVIEW OF SYSTEMS:  Negative except for the HPI at this time.   EXAMINATION AT ADMISSION:  VITAL SIGNS:  Temperature 97.7.  Blood pressure  was 158/100 supine  with a heart rate of 92, 148/96 sitting with a heart rate  of 100, 140/100 standing with a heart rate of 110.  GENERAL APPEARANCE:  This is a well-nourished woman who appears ill but in  no acute distress.  HEENT:  Normocephalic, atraumatic.  Conjunctivae and sclerae were clear.  Pupils were equal and reactive.  Oropharynx with no lesions.  Posterior  pharynx with cobblestone appearance.  NECK:  Supple.  There is no thyromegaly.  NODES:  No adenopathy was noted in the cervical or supraclavicular regions.  CHEST:  The patient is moving air well.  Her lungs were clear to  auscultation and percussion.  CARDIOVASCULAR:  Shows 2+ radial pulse with mild tachycardia as noted.  No  murmurs, rubs, or gallops were appreciated.  BREAST:  Deferred.  ABDOMEN:  The patient had hypoactive bowel sounds.  She was diffusely tender  to palpation.  There were no masses.  There was no organosplenomegaly.  PELVIC AND RECTAL:  Deferred.  EXTREMITIES:  Without clubbing, cyanosis, edema, or deformity.  SKIN:  Dry but no skin lesions were noted.  NEUROLOGIC:  Grossly nonfocal.   ASSESSMENT AND PLAN:  Dehydration.  The patient with probable viral  gastroenteritis now admitted because of refractory nausea and vomiting along  with diarrhea.  She is orthostatic in the office.  Plan:  The patient to be  admitted to a regular bed.  Will start IV fluid hydration with normal saline  at 200 mL/hour for the first liter and then 125 mL/hour after that.  Will  give IV Phenergan 12.5 mg q.6h. p.r.n.  Will continue her home medications  including Tapazole 10 mg daily, OxyContin 20 mg b.i.d., etodolac 500 mg  b.i.d., and oxycodone as needed for breakthrough pain.  Laboratory will  include a repeat basic metabolic panel.  Rosalyn Gess Norins, M.D. [facility]    MEN/MEDQ  D:  10/08/2003  T:  10/08/2003  Job:  [id number]

## 2011-03-13 NOTE — H&P (Signed)
NAME:  [name] [name], [name] [name] NO.:  [id number]   MEDICAL RECORD NO.:  [id number]                   PATIENT TYPE:  OIB   LOCATION:  5725                                 FACILITY:  [facility]   PHYSICIAN:  Guadelupe Sabin, M.D.             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   PRESENT ILLNESS:  This patient was doing well in good visual health.  She  was seen by Dr. Glenford Peers, her regular ophthalmologist, and found to have  retinal holes and possible retinal detachment and/or ischesis.  The patient  was referred to my office where this diagnosis was confirmed and  arrangements made for her outpatient admission at this time.   PAST MEDICAL HISTORY:  Patient has a history of pituitary tumor removed in  2000.  Patient is on a Methadone regimen.  Takes multiple medications  including Diclofenac, Estradiol, Amitriptyline, Protonix, Methadone, and  thyroid. She is under the care of Drs. Norins and [employer].   REVIEW OF SYSTEMS:  No cardiorespiratory complaints.   PHYSICAL EXAMINATION:  VITAL SIGNS:  As recorded on admission, blood  pressure 151/78, pulse 82, respirations 20, temperature 97.5.  GENERAL APPEARANCE:  The patient is a pleasant, well-nourished, well-  developed, Afro-American in no acute distress.  HEENT:  Eyes: Visual acuity 20/20 in each eye without correction.  Slit lamp  examination normal.  Applanation tonometry normal.  Dilated fundus  examination:  Right eye normal.  Left eye shows several areas of peripheral  lattice retinal degeneration at the 12 and 6 o'clock position.  Multiple  retinal holes are located at the 1 o'clock, 2 o'clock, and 2:30 position.  A  flat retinal detachment is present from the 1:30 to  3 o'clock position.  The  macular area is still attached.  CHEST:  Lungs clear to percussion and auscultation.  HEART:  Normal sinus rhythm.  No cardiomegaly.  No murmurs.  ABDOMEN:  Negative.  EXTREMITIES:  Negative.   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   SURGICAL PLAN:  Scleral buckling with possible vitrectomy.                                                Guadelupe Sabin, M.D.    HNJ/MEDQ  D:  05/02/2004  T:  05/02/2004  Job:  [id number]   cc:   Rosalyn Gess. Norins, M.D. [facility]   Sean A. Everardo All, M.D. [facility]   Delon Sacramento, M.D.  [phone]  [place]  [place] [address]  Fax: [phone]

## 2011-03-13 NOTE — Discharge Summary (Signed)
NAMEMarland Kitchen  [name] [name], [name] [name] NO.:  [id number]   MEDICAL RECORD NO.:  [id number]                   PATIENT TYPE:  INP   LOCATION:  0381                                 FACILITY:  [facility]   PHYSICIAN:  Titus [name]. Alwyn Ren, M.D. [facility]         DATE OF BIRTH:  08-09-1946   DATE OF ADMISSION:  10/08/2003  DATE OF DISCHARGE:  10/13/2003                                 DISCHARGE SUMMARY   ADMISSION DIAGNOSES:  Nausea, vomiting, diarrhea.   DISCHARGE DIAGNOSES:  1. Nausea, vomiting, diarrhea.  2. Nonspecific gastritis at endoscopy.   PROCEDURES:  Upper endoscopy and dilatation for dysphagia without stricture  with a Maloney dilator 16 mm.   BRIEF HISTORY:  [name] [name] is a 65 year old African-American female who has  had six days of nausea and vomiting associated with some diarrhea.  She has  been seen at the [facility] Emergency Room October 06, 2003.  At that time, CBC  and differential, comprehensive metabolic profile, amylase, were normal  along with a KUB.  IV fluids were administered and she was given a  prescription for Phenergan.  Despite this, she was still unable to keep down  food or liquids with persistent nausea, vomiting, diarrhea.  She was unable  to sustain the Phenergan orally prompting an office visit where she was  found to have some orthostasis.   She was admitted for IV antinausea medication and IV fluids.   Her past medical history is recorded on Dr. Debby Bud' History and Physical on  October 08, 2003.   HOSPITAL COURSE:  She was seen in consultation by gastroenterology who  performed an endoscopy on December 16th.  This revealed unspecified  gastritis.  She did undergo Maloney dilatation because of dysphagia although  there was no visualized stricture.  It was their recommendation that she  continue proton-pump inhibitor initially twice a day along with  metoclopramide; prescription was provided by GI.   Laboratory studies revealed a normal white  count and normal hematocrit of  40.  Differential was normal.  Sed rate was normal at 8.  Her basic  metabolic profile was normal and TSH was therapeutic.  She is on Tapazole  for hypothyroidism.   On the day of discharge, she was still exhibiting some nausea and meal  intake was somewhat variable from 25% to 100%.  She had some residual  burning in her stomach.  She was afebrile with a pulse of 76, respiratory  rate of 20, and blood pressure of 129/61.  There were no significant  murmurs; heart rhythm was regular.  Bowel sounds were present and abdomen  was soft.   The endoscopic photographs were reviewed with her which revealed significant  gastritis.   It was recommended that she advance her diet as tolerated and avoid the  triggers for reflux and hyperacidity.  These would include the aspirin  family (ibuprofen, naproxen, and aspirin) and possibly the etodolac (Lodine)  which she was taking b.i.d.  She was informed that Tylenol would not  aggravate this condition.  Additionally she was asked to avoid alcohol,  peppermint, tobacco (she is a nonsmoker) and caffeine (coffee, tea, cola, or  chocolate).  She was asked not to eat within three hours of going to bed.   She will continue her home meds of OxyContin 20 mg twice a day and oxycodone  5/3.25 as needed, and Tapazole.  Diet was to be bland and advanced as  tolerated.  She has been on Plaquenil in the past for lupus and has been  followed by [name] [name], M.D.  She will return to him as recommended  by Dr. Debby Bud.   DISCHARGE STATUS:  Improved.   PROGNOSIS:  Good.   She has been asked to see [name] [name], M.D. in 2-3 weeks.  She will  follow up with [name] [name], M.D. on January 3rd at 3 p.m.   She was given a copy of her laboratory studies to take to those visits.  Also she was asked to take her medications and a copy of the discharge  instructions provided to her at the time of discharge.   This discharge  summary was dictated in her presence to facilitate  understanding in continuity of care.                                               Titus [name]. Alwyn Ren, M.D. [facility]    WFH/MEDQ  D:  10/13/2003  T:  10/13/2003  Job:  [id number]   cc:   [name] [name], M.D. [name], M.D. [facility]   [name] [name], M.D.

## 2011-03-13 NOTE — Op Note (Signed)
NAME:  [name] [name], [name] [name]                           ACCOUNT NO.:  [id number]   MEDICAL RECORD NO.:  [id number]                   PATIENT TYPE:  AMB   LOCATION:  ENDO                                 FACILITY:  [facility]   PHYSICIAN:  Malcolm T. Russella Dar, M.D. [facility]          DATE OF BIRTH:  09-18-1946   DATE OF PROCEDURE:  12/11/2003  DATE OF DISCHARGE:  12/11/2003                                 OPERATIVE REPORT   PROCEDURE:  Esophageal manometry   GASTROENTEROLOGIST:  Judie Petit T. Russella Dar, M.D.   INDICATIONS:  A 65 year old female with dysphagia.   RESULTS:  1. Upper esophageal sphincter:  Normal pressures and normal relaxation.  2. Esophageal body:  Normal tracing apparent on review; however, the     computer analysis read 50% simultaneous contractions.  This was not     confirmed by visual observation.  One channel in the distal port read 188     mmHg.  The other channels in the lower esophageal body were normal.  My     overall impression was that the pressures and peristaltic tracings     appeared normal.  3. Lower esophageal sphincter:  Elevated resting pressure at 60.4 mmHg,     normal residual pressure at 3.3 mmHg with 93% relaxation.   IMPRESSION:  Hypertensive lower esophageal sphincter with normal relaxation.  No other abnormalities confirmed.   RECOMMENDATION:  Discuss findings at return office visit.                                               Venita Lick. Russella Dar, M.D. [facility]    MTS/MEDQ  D:  12/21/2003  T:  12/21/2003  Job:  [id number]

## 2011-03-19 ENCOUNTER — Telehealth: Payer: Self-pay | Admitting: *Deleted

## 2011-03-19 NOTE — Telephone Encounter (Signed)
Pt has tried to taper as instructed from methadone. She is miserable, has been having sweats, chills and extreme fatigue. 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

## 2011-03-20 ENCOUNTER — Ambulatory Visit (INDEPENDENT_AMBULATORY_CARE_PROVIDER_SITE_OTHER): Payer: Medicare Other | Admitting: Internal Medicine

## 2011-03-20 VITALS — BP 128/84 | HR 77 | Temp 97.9°F | Wt 172.0 lb

## 2011-03-20 DIAGNOSIS — F19939 Other psychoactive substance use, unspecified with withdrawal, unspecified: Secondary | ICD-10-CM

## 2011-03-20 DIAGNOSIS — F1193 Opioid use, unspecified with withdrawal: Secondary | ICD-10-CM

## 2011-03-20 DIAGNOSIS — F1123 Opioid dependence with withdrawal: Secondary | ICD-10-CM

## 2011-03-20 DIAGNOSIS — F1120 Opioid dependence, uncomplicated: Secondary | ICD-10-CM

## 2011-03-20 NOTE — Telephone Encounter (Signed)
Is she taking MS contin? May have been too fast a taper from methadone. Needs ov today.

## 2011-03-20 NOTE — Progress Notes (Signed)
  Subjective:    Patient ID: [name] [name], female    DOB: 01-27-1946, 65 y.o.   MRN: [id number]  HPI [name] [name] presents today for a week long history of having hot flashes followed by cold chills with mild diaphoresis followed by mild nausea. No tremor. She is very low on energy after these symptoms. This is happening approximately every 30 minutes. The on-set of symptoms coincides with her rapid taper off methadone  PMH, FamHx and SocHx reviewed for any changes and relevance.    Review of Systems  Constitutional: Positive for fever and chills.  HENT: Positive for neck pain and postnasal drip. Negative for hearing loss, ear pain and congestion.   Eyes: Negative.   Respiratory: Negative for cough, chest tightness and wheezing.   Cardiovascular: Negative.   Gastrointestinal: Negative.   Genitourinary: Negative.   Musculoskeletal: Positive for back pain, arthralgias and gait problem.  Neurological: Positive for tremors, weakness, light-headedness and headaches. Negative for dizziness, seizures, syncope, facial asymmetry, speech difficulty and numbness.  Hematological: Negative.   Psychiatric/Behavioral: Negative for confusion, dysphoric mood and agitation.       Objective:   Physical Exam Vitals noted Gen-l - WNWD AA woman in no acute distress HEENT - [place]/AT, C&S without icterus Resp - unlabored Cor - RRR       Assessment & Plan:  1. Withdrawal - symptoms temporally related to rapid methadone tape which may have been to rapid!  Plan - slow methadone taper see pt instructions.

## 2011-03-20 NOTE — Patient Instructions (Signed)
Symptoms may be methadone withdrawal. There is no sign of infection to explain your symptoms. It is unlikely that you would reenter menopause. Plan - continue MS Contin 15mg  bid. Restart methadone at 1 tablet 3 times a day. See if within 24 hours you see an improvement in your symptoms. If you DO, then take the methadone 3 times a day Sunday, Monday go to 2 times a day for 5 days and then to 1 tab a day for 5 days. At any point if symptoms return resume the most recent previous dose for an additional  3-5 days and then try to reduce the dose again. If you have questions or problems email be over the weekend: [email or web address]

## 2011-03-20 NOTE — Telephone Encounter (Signed)
Scheduled for OV today at 4:15.

## 2011-03-27 ENCOUNTER — Ambulatory Visit (INDEPENDENT_AMBULATORY_CARE_PROVIDER_SITE_OTHER): Payer: Medicare Other | Admitting: Internal Medicine

## 2011-03-27 DIAGNOSIS — M5412 Radiculopathy, cervical region: Secondary | ICD-10-CM

## 2011-03-27 DIAGNOSIS — M5010 Cervical disc disorder with radiculopathy, unspecified cervical region: Secondary | ICD-10-CM | POA: Insufficient documentation

## 2011-03-27 NOTE — Assessment & Plan Note (Signed)
Patient with progressive pain in both arms, worse proximally but extending to the hands. She has decreased ROM shoulders.   Plan - keep appointment with neurosurgeon - may need surgery.            Unlikely that this is shoulder related.

## 2011-03-27 NOTE — Progress Notes (Signed)
  Subjective:    Patient ID: [name] [name], female    DOB: Jun 07, 1946, 65 y.o.   MRN: [id number]  HPI [name] was seen recently for what appeared to be methadone withdrawal symptoms. She was restarted on methadone with a much longer taper. She reports that her symptoms are much better.  Today she has concerns about on-going severe bilateral UE pain. 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 However, her symptoms are severe. She does have an appointment with Dr. Wynetta Emery, neurosurgeon  on June 22nd.  She has several "lumps" she is worried about: at the base of the skull, behind the left knee, on the dorsum of both wrists.   PMH, FamHx and SocHx reviewed for any changes and relevance.   Review of Systems Review of Systems  Constitutional:  Negative for fever, chills, activity change and unexpected weight change.  HENT:  Negative for hearing loss, ear pain, congestion, neck stiffness and postnasal drip.   Eyes: Negative for pain, discharge and visual disturbance.  Respiratory: Negative for chest tightness and wheezing.   Cardiovascular: Negative for chest pain and palpitations.       [No decreased exercise tolerance Gastrointestinal: [No change in bowel habit. No bloating or gas. No reflux or indigestion Genitourinary: Negative for urgency, frequency, flank pain and difficulty urinating.  Musculoskeletal: Negative for myalgias, back pain, arthralgias and gait problem. Severe pain both arms with decreased ROM Neurological: Negative for dizziness, tremors, weakness and headaches.  Hematological: Negative for adenopathy.  Psychiatric/Behavioral: Negative for behavioral problems and dysphoric mood.       Objective:   Physical Exam Vitals reviewed Gen'l- WNWD AA woman in no severe distress Neck - no mass or tumor. What she points to is the C7 vs T1 vertebra. Ext  - small hard cyst dorsum left hand c/w ganglion; several soft, non-tender small lumps dorsum right hand; normal tissue vs small baker's cyst left popliteal fossa.       Assessment & Plan:

## 2011-04-03 ENCOUNTER — Other Ambulatory Visit: Payer: Self-pay | Admitting: *Deleted

## 2011-04-03 ENCOUNTER — Telehealth: Payer: Self-pay | Admitting: *Deleted

## 2011-04-03 MED ORDER — MORPHINE SULFATE CR 15 MG PO TB12: 15.0000 mg | ORAL_TABLET | Freq: Two times a day (BID) | ORAL | Status: DC

## 2011-04-03 NOTE — Telephone Encounter (Signed)
Patient requesting RF of Morphine - Will be out today and rides the bus, ok to RF?

## 2011-04-03 NOTE — Telephone Encounter (Signed)
Ok per MD, pt aware, Rx's ready.

## 2011-04-08 ENCOUNTER — Telehealth: Payer: Self-pay | Admitting: *Deleted

## 2011-04-08 DIAGNOSIS — M25519 Pain in unspecified shoulder: Secondary | ICD-10-CM

## 2011-04-08 NOTE — Telephone Encounter (Signed)
Patient informed. 

## 2011-04-08 NOTE — Telephone Encounter (Signed)
Patient requesting xray of her right shoulder. She hit her shoulder on doorframe early May. She has OV with Dr Ree Kida(?) Monday.

## 2011-04-08 NOTE — Telephone Encounter (Signed)
Dr. Ree Kida??? OK for x-ray of right shoulder dx shoulder pain

## 2011-04-10 ENCOUNTER — Ambulatory Visit (INDEPENDENT_AMBULATORY_CARE_PROVIDER_SITE_OTHER)
Admission: RE | Admit: 2011-04-10 | Discharge: 2011-04-10 | Disposition: A | Discharge status: Home / Self Care | Payer: Medicare Other | Source: Ambulatory Visit | Attending: Internal Medicine | Admitting: Internal Medicine

## 2011-04-10 DIAGNOSIS — M25519 Pain in unspecified shoulder: Secondary | ICD-10-CM

## 2011-05-06 ENCOUNTER — Inpatient Hospital Stay (HOSPITAL_COMMUNITY): Admission: RE | Admit: 2011-05-06 | Payer: Medicare Other | Source: Ambulatory Visit | Admitting: Neurosurgery

## 2011-06-04 ENCOUNTER — Ambulatory Visit (HOSPITAL_COMMUNITY)
Admission: RE | Admit: 2011-06-04 | Discharge: 2011-06-04 | Disposition: A | Discharge status: Home / Self Care | Payer: Medicare Other | Source: Ambulatory Visit | Attending: Neurosurgery | Admitting: Neurosurgery

## 2011-06-04 ENCOUNTER — Encounter (HOSPITAL_COMMUNITY)
Admission: RE | Admit: 2011-06-04 | Discharge: 2011-06-04 | Disposition: A | Discharge status: Home / Self Care | Payer: Medicare Other | Source: Ambulatory Visit | Attending: Neurosurgery | Admitting: Neurosurgery

## 2011-06-04 ENCOUNTER — Other Ambulatory Visit (HOSPITAL_COMMUNITY): Payer: Self-pay | Admitting: Neurosurgery

## 2011-06-04 DIAGNOSIS — R52 Pain, unspecified: Secondary | ICD-10-CM

## 2011-06-04 DIAGNOSIS — Z01818 Encounter for other preprocedural examination: Secondary | ICD-10-CM | POA: Insufficient documentation

## 2011-06-04 DIAGNOSIS — Z01810 Encounter for preprocedural cardiovascular examination: Secondary | ICD-10-CM | POA: Insufficient documentation

## 2011-06-04 DIAGNOSIS — Z01812 Encounter for preprocedural laboratory examination: Secondary | ICD-10-CM | POA: Insufficient documentation

## 2011-06-04 LAB — CBC
MCH: 29.1 pg (ref 26.0–34.0)
MCHC: 33.6 g/dL (ref 30.0–36.0)
RDW: 14.7 % (ref 11.5–15.5)

## 2011-06-04 LAB — BASIC METABOLIC PANEL
Calcium: 9.4 mg/dL (ref 8.4–10.5)
GFR calc Af Amer: >60 mL/min (ref >60)
GFR calc non Af Amer: >60 mL/min (ref >60)
Glucose, Bld: 91 mg/dL (ref 70–99)
Potassium: 4.1 mEq/L (ref 3.5–5.1)
Sodium: 140 mEq/L (ref 135–145)

## 2011-06-04 LAB — SURGICAL PCR SCREEN: Staphylococcus aureus: NEGATIVE

## 2011-06-10 ENCOUNTER — Telehealth: Payer: Self-pay | Admitting: *Deleted

## 2011-06-10 MED ORDER — METHIMAZOLE 10 MG PO TABS: 10.0000 mg | ORAL_TABLET | Freq: Every day | ORAL | Status: DC

## 2011-06-10 MED ORDER — HYDROCHLOROTHIAZIDE 12.5 MG PO CAPS: 12.5000 mg | ORAL_CAPSULE | Freq: Every day | ORAL | Status: DC

## 2011-06-10 NOTE — Telephone Encounter (Signed)
REfills

## 2011-06-15 ENCOUNTER — Inpatient Hospital Stay (HOSPITAL_COMMUNITY)
Admission: RE | Admit: 2011-06-15 | Discharge: 2011-06-16 | DRG: 473 | Disposition: A | Discharge status: Home Health | Payer: Medicare Other | Source: Ambulatory Visit | Attending: Neurosurgery | Admitting: Neurosurgery

## 2011-06-15 ENCOUNTER — Inpatient Hospital Stay (HOSPITAL_COMMUNITY): Payer: Medicare Other

## 2011-06-15 DIAGNOSIS — Z23 Encounter for immunization: Secondary | ICD-10-CM

## 2011-06-15 DIAGNOSIS — M47812 Spondylosis without myelopathy or radiculopathy, cervical region: Principal | ICD-10-CM | POA: Diagnosis present

## 2011-06-15 DIAGNOSIS — E669 Obesity, unspecified: Secondary | ICD-10-CM | POA: Diagnosis present

## 2011-06-15 DIAGNOSIS — I10 Essential (primary) hypertension: Secondary | ICD-10-CM | POA: Diagnosis present

## 2011-06-15 DIAGNOSIS — Z853 Personal history of malignant neoplasm of breast: Secondary | ICD-10-CM

## 2011-06-15 HISTORY — PX: CERVICAL SPINE SURGERY: SHX589

## 2011-06-19 ENCOUNTER — Emergency Department (HOSPITAL_COMMUNITY)
Admission: EM | Admit: 2011-06-19 | Discharge: 2011-06-19 | Disposition: A | Discharge status: Home / Self Care | Payer: Medicare Other | Attending: Emergency Medicine | Admitting: Emergency Medicine

## 2011-06-19 ENCOUNTER — Telehealth: Payer: Self-pay | Admitting: *Deleted

## 2011-06-19 DIAGNOSIS — M329 Systemic lupus erythematosus, unspecified: Secondary | ICD-10-CM | POA: Insufficient documentation

## 2011-06-19 DIAGNOSIS — R51 Headache: Secondary | ICD-10-CM | POA: Insufficient documentation

## 2011-06-19 DIAGNOSIS — E039 Hypothyroidism, unspecified: Secondary | ICD-10-CM | POA: Insufficient documentation

## 2011-06-19 NOTE — Telephone Encounter (Signed)
Home health RN called - at home visit, post op fusion by Dr Wynetta Emery(?), pt c/o h/a worse than ever before first 6-7 out of 10 then later RN checked on pt it was 8 out of 10. No visual changes. Vitals - 99.4, 132/88, p 118, resp 20. They left vm regarding advisement from MD but also said that she would be suggesting that patient go to the ER due to severity of pain and that it is an abnormal h/a and elevated HR. I see pt arrived at ER via ambulance per [facility] records.

## 2011-06-19 NOTE — Telephone Encounter (Signed)
NOTED

## 2011-06-23 ENCOUNTER — Ambulatory Visit (INDEPENDENT_AMBULATORY_CARE_PROVIDER_SITE_OTHER): Payer: Medicare Other | Admitting: Internal Medicine

## 2011-06-23 DIAGNOSIS — I10 Essential (primary) hypertension: Secondary | ICD-10-CM

## 2011-06-23 DIAGNOSIS — E0590 Thyrotoxicosis, unspecified without thyrotoxic crisis or storm: Secondary | ICD-10-CM

## 2011-06-23 DIAGNOSIS — D509 Iron deficiency anemia, unspecified: Secondary | ICD-10-CM

## 2011-06-23 DIAGNOSIS — R51 Headache: Secondary | ICD-10-CM

## 2011-06-23 MED ORDER — METHADONE HCL 10 MG PO TABS: 30.0000 mg | ORAL_TABLET | Freq: Three times a day (TID) | ORAL | Status: DC

## 2011-06-23 MED ORDER — MORPHINE SULFATE CR 15 MG PO TB12: 15.0000 mg | ORAL_TABLET | Freq: Two times a day (BID) | ORAL | Status: DC

## 2011-06-23 MED ORDER — METOPROLOL TARTRATE 25 MG PO TABS: 25.0000 mg | ORAL_TABLET | Freq: Two times a day (BID) | ORAL | Status: DC

## 2011-06-23 NOTE — Patient Instructions (Signed)
Headache - normal neuro exam today. Do not know source of headache but I am glad you are seeing Dr. Wynetta Emery on Thursday. Plan - will treat BP (see below), ok to use NSAID, eg. Ibuprofen 200mg  3 tabs 3 times a day. Alternatively you can take 2 aleve twice a day.  Blood pressure - too high along with rapid pulse. Pain driven vs BP driving headache?? Plan continue present medications. Will add a beta-blocker, lopressor 25 mg twice a day for BP and heart rate. Call if the heart rate does not come down.

## 2011-06-23 NOTE — Progress Notes (Signed)
  Subjective:    Patient ID: [name] [name], female    DOB: Jun 12, 1946, 65 y.o.   MRN: [id number]  HPI [name] [name] is 9 days s/p anterior micro-diskectomy and fixation by Dr. Wynetta Emery. Starting 2 days post-op she had the onset of a severe headache in a frontral vertex distribution. She had accompanying elevation in heart rate and blood pressure. She was seen in the ED Friday, August 24th. Reviewed record: no xray and no labs. She was given IV pain medication and d/c home. She had return of the headache that night. She has been able to sleep. She has had no N/V, no double vision, no facial or UE paresthesia, no epistaxis. Today she is still in pain and her BP and pulse rate are elevated.  I have reviewed the patient's medical history in detail and updated the computerized patient record.    Review of Systems System review is negative for any constitutional, cardiac, pulmonary, GI or neuro symptoms or complaints     Objective:   Physical Exam Vitals reviewed - stable with mild elevation in BP Gen'l - WNWD AA woman in no distress but uncomfortable HEENT - C&S clear Cor - RRR Pulm - normal respiration. Neuro - A&O x 3, CN II-XII normal, normal gait.       Assessment & Plan:  Headache - normal neuro exam. Symptoms are suggestive of tension type headache although with recent surgery there are other possibilities.  Plan - OTC NSAIDs for HA           Keep appointment with Dr. Wynetta Emery.

## 2011-06-24 ENCOUNTER — Other Ambulatory Visit (INDEPENDENT_AMBULATORY_CARE_PROVIDER_SITE_OTHER): Payer: Medicare Other

## 2011-06-24 DIAGNOSIS — D509 Iron deficiency anemia, unspecified: Secondary | ICD-10-CM

## 2011-06-24 DIAGNOSIS — E0590 Thyrotoxicosis, unspecified without thyrotoxic crisis or storm: Secondary | ICD-10-CM

## 2011-06-24 LAB — CBC WITH DIFFERENTIAL/PLATELET
Basophils Absolute: 0.1 10*3/uL (ref 0.0–0.1)
Eosinophils Relative: 3.5 % (ref 0.0–5.0)
HCT: 32 % — ABNORMAL LOW (ref 36.0–46.0)
Lymphs Abs: 3.5 10*3/uL (ref 0.7–4.0)
MCV: 88.7 fl (ref 78.0–100.0)
Monocytes Absolute: 0.7 10*3/uL (ref 0.1–1.0)
Monocytes Relative: 7.6 % (ref 3.0–12.0)
Neutrophils Relative %: 51.7 % (ref 43.0–77.0)
Platelets: 337 10*3/uL (ref 150.0–400.0)
RDW: 15.2 % — ABNORMAL HIGH (ref 11.5–14.6)
WBC: 9.6 10*3/uL (ref 4.5–10.5)

## 2011-06-24 LAB — TSH: TSH: 0.98 u[IU]/mL (ref 0.35–5.50)

## 2011-06-24 LAB — T4, FREE: Free T4: 1.03 ng/dL (ref 0.60–1.60)

## 2011-06-25 NOTE — Assessment & Plan Note (Signed)
Patient with elevated BP and heart rate.  Plan - add low dose lopressor, 25mg , bid

## 2011-06-26 ENCOUNTER — Telehealth: Payer: Self-pay | Admitting: *Deleted

## 2011-06-26 DIAGNOSIS — D649 Anemia, unspecified: Secondary | ICD-10-CM

## 2011-06-26 NOTE — Telephone Encounter (Signed)
1. hgb down 1 g from August 9th. No signs of bleeding.  Plan - lab Tuesday - H/H, iron panel, retic count  2. Headache - much better

## 2011-06-26 NOTE — Telephone Encounter (Signed)
Patient requesting results of labs.  

## 2011-06-30 ENCOUNTER — Other Ambulatory Visit: Payer: Self-pay | Admitting: Internal Medicine

## 2011-06-30 ENCOUNTER — Ambulatory Visit: Payer: Medicare Other

## 2011-06-30 DIAGNOSIS — D649 Anemia, unspecified: Secondary | ICD-10-CM

## 2011-06-30 DIAGNOSIS — D509 Iron deficiency anemia, unspecified: Secondary | ICD-10-CM

## 2011-07-02 ENCOUNTER — Telehealth: Payer: Self-pay | Admitting: *Deleted

## 2011-07-02 NOTE — Telephone Encounter (Signed)
Hgb is actually better. BP was an issue - she can report back BP readings or come by for nurse BP check at her convenience

## 2011-07-02 NOTE — Telephone Encounter (Signed)
Patient informed, she will call back to schedule nurse visit

## 2011-07-02 NOTE — Telephone Encounter (Signed)
Patient requesting results of H&H. Gave pt results of labs from Tuesday, when does she f/u again?

## 2011-07-03 ENCOUNTER — Ambulatory Visit (INDEPENDENT_AMBULATORY_CARE_PROVIDER_SITE_OTHER): Payer: Medicare Other | Admitting: *Deleted

## 2011-07-03 DIAGNOSIS — I10 Essential (primary) hypertension: Secondary | ICD-10-CM

## 2011-07-06 ENCOUNTER — Encounter: Payer: Self-pay | Admitting: Internal Medicine

## 2011-07-09 ENCOUNTER — Ambulatory Visit (INDEPENDENT_AMBULATORY_CARE_PROVIDER_SITE_OTHER): Payer: Medicare Other | Admitting: Internal Medicine

## 2011-07-09 ENCOUNTER — Encounter: Payer: Self-pay | Admitting: Internal Medicine

## 2011-07-09 DIAGNOSIS — M5412 Radiculopathy, cervical region: Secondary | ICD-10-CM

## 2011-07-09 DIAGNOSIS — M5010 Cervical disc disorder with radiculopathy, unspecified cervical region: Secondary | ICD-10-CM

## 2011-07-09 MED ORDER — GABAPENTIN 300 MG PO CAPS: ORAL_CAPSULE | ORAL | Status: DC

## 2011-07-09 NOTE — Progress Notes (Signed)
  Subjective:    Patient ID: [name] [name], female    DOB: 1946-04-07, 65 y.o.   MRN: [id number]  HPI [name] [name] presents for continued problem with fatigue with no cause.   She is having on-going pain right shoulder with radiation to the biceps and to the hand. It is VERY painful despite being on MS Contin. She has had cervical disk surgery and fusion and her left arm symptoms are better. Reviewed MRI c-spine, surgical note.  I have reviewed the patient's medical history in detail and updated the computerized patient record.    Review of Systems System review is negative for any constitutional, cardiac, pulmonary, GI or neuro symptoms or complaints     Objective:   Physical Exam Vitals noted Gen'l - WNWD AA woman who is uncomfortable but in no acute distress. HEENT - well healed surgical scar anterior neck, C&S clear Neck- good ROM Ext- right UE with normal ROM wrist, elbow, shoulder. Normal circulation.         Assessment & Plan:

## 2011-07-09 NOTE — Patient Instructions (Signed)
Right Arm pain sounds and acts like nerve compression related pain involving the C4-5, C5-6 level. Reading the operative report it seems that Dr. Glee Arvin did open up the foramen where there was bony compression. You may have residual soft tissue swelling or possibly some disk protrusion causing continued nerve root compression. It is VERY unlikely that the pain is related to something wrong with the bone, and there joints (shoulder, elbow) move very well without signs of joint damage.  Plan - will start gabapentin, a medication specifically useful for the treatment of nerve related pain, at 300 mg once at bedtime for three days, then twice a day for three days and then three times a day. A titration that will hopefully avoid excessive drowsiness from the medication. If there is still a lot of pain then the dose can be increased to 600 mg (2 x 300mg ) three times a day. Of course you should discuss your symptoms with Dr. Glee Arvin at your follow up visit.

## 2011-07-12 NOTE — Assessment & Plan Note (Signed)
Patient now with radicular symptoms right arm. No indication for repeat imaging of the cervical spine at this time.  Plan - trial of gabapentin in addition to MS Contin for pain control. Will titrate up to 600 mg tid and reassess.

## 2011-07-22 ENCOUNTER — Encounter: Payer: Self-pay | Admitting: Internal Medicine

## 2011-07-22 ENCOUNTER — Ambulatory Visit (INDEPENDENT_AMBULATORY_CARE_PROVIDER_SITE_OTHER): Payer: Medicare Other | Admitting: Internal Medicine

## 2011-07-22 VITALS — BP 130/82 | HR 82 | Temp 98.1°F | Ht 66.0 in | Wt 178.8 lb

## 2011-07-22 DIAGNOSIS — R209 Unspecified disturbances of skin sensation: Secondary | ICD-10-CM

## 2011-07-22 DIAGNOSIS — R200 Anesthesia of skin: Secondary | ICD-10-CM

## 2011-07-22 NOTE — Progress Notes (Signed)
  Subjective:    Patient ID: [name] [name], female    DOB: May 05, 1946, 65 y.o.   MRN: [id number]  HPI complains of numbness  Located under right side of jaw Intermittent symptoms Onset 05/2011 prior to cervical fusion No weakness in tongue or voice changes Started gabapentin - R arm improved but no change in jaw  Past Medical History  Diagnosis Date  . SLE (systemic lupus erythematosus)     Per Dr Corliss Skains  . Fibromyalgia     ?  [name] History of breast cancer   . HTN (hypertension)   . Chronic headaches   . DJD (degenerative joint disease)   . Anemia, iron deficiency     remote  . Depression   . Hx of adenomatous colonic polyps 11/2004  . GERD (gastroesophageal reflux disease)   . Internal hemorrhoids   . LBP (low back pain)   . Spinal stenosis   . Esophageal stricture   . TMJ (temporomandibular joint syndrome)      Review of Systems  Respiratory: Negative for shortness of breath and wheezing.   Cardiovascular: Negative for chest pain and palpitations.  Neurological: Negative for light-headedness and headaches.       Objective:   Physical Exam BP 130/82  Pulse 82  Temp(Src) 98.1 F (36.7 C) (Oral)  Ht 5\' 6"  (1.676 m)  Wt 178 lb 12.8 oz (81.103 kg)  BMI 28.86 kg/m2  SpO2 97% Constitutional: She is well-developed and well-nourished. No distress.  HENT: Head: Normocephalic and atraumatic. Ears: B TMs ok, no erythema or effusion; Nose: Nose normal.  Mouth/Throat: Oropharynx is clear and moist. No oropharyngeal exudate.  Eyes: Conjunctivae and EOM are normal. Pupils are equal, round, and reactive to light. No scleral icterus.  Neck: Normal range of motion. Neck supple. No JVD present. No thyromegaly present.  Cardiovascular: Normal rate, regular rhythm and normal heart sounds.  No murmur heard. No BLE edema. Pulmonary/Chest: Effort normal and breath sounds normal. No respiratory distress. She has no wheezes.  Neurological: She is alert and oriented to person, place,  and time. No cranial nerve deficit. Coordination normal.  Skin: Skin is warm and dry. No rash noted. No erythema.  Psychiatric: She has a normal mood and affect. Her behavior is normal. Judgment and thought content normal.   Lab Results  Component Value Date   WBC 9.6 06/24/2011   HGB 11.0* 06/30/2011   HCT 33.0* 06/30/2011   PLT 337.0 06/24/2011   CHOL 169 11/06/2010   TRIG 98.0 11/06/2010   HDL 78.70 11/06/2010   ALT 18 11/06/2010   AST 26 11/06/2010   NA 140 06/04/2011   K 4.1 06/04/2011   CL 102 06/04/2011   CREATININE 0.88 06/04/2011   BUN 12 06/04/2011   CO2 30 06/04/2011   TSH 0.98 06/24/2011        Assessment & Plan:  Numbness - suspect neuropathic, ?related to recent cervical fusion Increase gabapentin and follow up with nsurg and PCP

## 2011-07-22 NOTE — Patient Instructions (Signed)
It was good to see you today. Increase gabapentin as we discussed and keep follow up with Illene Regulus, MD, MD and Dr. Wynetta Emery as discussed

## 2011-07-23 ENCOUNTER — Ambulatory Visit
Admission: RE | Admit: 2011-07-23 | Discharge: 2011-07-23 | Disposition: A | Discharge status: Home / Self Care | Payer: Medicare Other | Source: Ambulatory Visit | Attending: Neurosurgery | Admitting: Neurosurgery

## 2011-07-23 ENCOUNTER — Other Ambulatory Visit: Payer: Self-pay | Admitting: Neurosurgery

## 2011-07-23 DIAGNOSIS — M542 Cervicalgia: Secondary | ICD-10-CM

## 2011-07-23 NOTE — Op Note (Signed)
NAME:  [name] [name], [name] [name] NO.:  [id number]  MEDICAL RECORD NO.:  [id number]  LOCATION:  3528                         FACILITY:  [facility]  PHYSICIAN:  Donalee Citrin, M.D.        DATE OF BIRTH:  03-Dec-1945  DATE OF PROCEDURE:  06/15/2011 DATE OF DISCHARGE:                              OPERATIVE REPORT   PREOPERATIVE DIAGNOSIS:  Cervical spondylosis with stenosis and radiculopathy C4-5, C5-6.  PROCEDURE:  Anterior cervical diskectomies and fusion at C4-5, C5-6 using PEEK cages packed with locally harvested autograft mixed with morselized allograft and Globus Addition plating system.  SURGEON:  Donalee Citrin, MD  ASSISTANT:  Kathaleen Maser. Pool, MD  ANESTHESIA:  General endotracheal.  HISTORY OF PRESENT ILLNESS:  The patient is a 65 year old female who has had longstanding neck and predominantly right arm pain, failed all forms of conservative treatment over the years.  MRI scan showed severe cervical stenosis, spinal cord compression at C5-6 and C4-5, were displaced on C5 and C6 pedicles respectively.  The patient is recommended anterior cervical diskectomy and fusion.  After failure of conservative treatment, the risks and benefits of the operation were explained to the patient.  The patient understood and agreed to proceed forward.  The patient was brought to the OR, induced general anesthesia, was positioned supine, the neck flexed in slight extension, 5 pounds of Halter traction.  The right side of her neck was prepped and draped in usual sterile fashion.  Preop x-ray localized the appropriate level so a curvilinear oblique incision was made just off the midline to the anterior border of the sternocleidomastoid.  Superficial layer of platysma was dissected and divided longitudinally.  The avascular anterior sternocleidomastoid and strap muscles was developed down to the prevertebral fascia.  Prevertebral fascia was then dissected with Kittners.  Intraoperative x-ray  identified at the appropriate level, so large anterior osteophytes have been bitten off with a Leksell rongeur. Disk spaces were incised.  Longus colli was reflected laterally and self- retaining retractor was placed.  Both disk spaces were then drilled down the posterior annulus osteophyte complex capturing the bone shavings in the mucus trap.  Then, we first working at C5-6 under microscope illumination, aggressive under biting both endplates was carried out. The dura was identified upon decompression of the left C6 nerve root and marching across the right.  There was noted to be ossification of the posterior longitudinal ligament and large bone spur coming off the C6 vertebral body as I was found to free up this bone spur due to subarachnoid and CSF.  Cleaned up around to decompress the spinal cord. Both aggressive interbody and both endplates are marching across slight to the right C6 nerve root at the end of this.  I put DuraSeal over the area of the CSF leak, which was immediately behind the endplate of C6 and the dura was actually underneath the bone of C6, have been torn. So, the Gelfoam DuraSeal packed this with the tensioning the C4-5 in a similar fashion, the C4-5 was drilled down capturing the bone shavings in the mucus trap.  Aggressive under biting of both endplates and removed the PLL helped decompressing central canal.  Both C5 pedicles were identified, both C5 nerve roots decompressed.  Then, the endplates were scraped, a size 7-mm PEEK cage was then inserted in C4-5, then working cross removing the cottonoids, Gelfoam in C5-6.  A 7-mm PEEK cage was entered and there was no CSF leaking at this time.  Then, contouring of the anterior bodies was carried out to place the plate.  A 30-mm Globus Addition plate was in place.  All six screws had excellent purchase.  Locking mechanisms were engaged.  Wound was then copiously irrigated.  Meticulous hemostasis was maintained.  The  platysma was reapproximated with Vicryl and skin was closed with running 4-0 subcuticular.  Benzoin and Steri-Strips were applied.  The patient went to the recovery room in stable condition.  At the end of the case, needle, instrument, and sponge count was correct.          ______________________________ Donalee Citrin, M.D.     GC/MEDQ  D:  06/15/2011  T:  06/16/2011  Job:  [id number]  Electronically Signed by Donalee Citrin M.D. on 07/23/2011 01:18:10 PM

## 2011-07-23 NOTE — H&P (Signed)
  NAMEMarland Kitchen  [name] [name], [name] [name] NO.:  [id number]  MEDICAL RECORD NO.:  [id number]  LOCATION:  3528                         FACILITY:  [facility]  PHYSICIAN:  Donalee Citrin, M.D.        DATE OF BIRTH:  12/04/1945  DATE OF ADMISSION:  06/15/2011 DATE OF DISCHARGE:  06/04/2011                             HISTORY & PHYSICAL   ADMISSION DIAGNOSIS:  Cervical spondylosis with radiculopathy C4-C5 and C5-C6.  HISTORY OF PRESENT ILLNESS:  The patient is a very pleasant 65 year old female with a longstanding neck pain that has been refractory to all forms of conservative treatment over the years.  Pain is predominantly in her neck, going into her right shoulder and on the right arm and then the first thumb and fourth finger of the right hand.  She has been dealing with this for many years and I have followed her for many years; and at this point, it has reached a crescendo with no longer relief by the pain medication.  PAST MEDICAL HISTORY:  Fairly remarkable.  She has had breast cancer, diagnosed on the left side, she had a lumpectomy.  She has also got a history of hypertension.  SURGICAL HISTORY:  Appendectomy, hysterectomy, detachment retina, pituitary tumor removed, benign breast biopsies, and a left lumpectomy for malignant disease, right TMJ, and bunionectomy.  SOCIAL HISTORY:  She is a nonsmoker, nondrinker.  PHYSICAL EXAMINATION:  GENERAL:  She is very pleasant, awake, alert, 65- year-old female in no acute distress. HEENT:  Within normal limits. EXTREMITIES:  Upper extremity strength is 5/5 in deltoid. Right biceps, triceps are slightly weak at 4+/5, otherwise she is 5/5 distally. Reflexes are normal and symmetric in both upper and lower extremities.  MRI scan shows spondylitic disease at C4-5 and C5-6.  ASSESSMENT AND PLAN:  The patient presents for anterior cervical discectomy with fusion.          ______________________________ Donalee Citrin, M.D.     GC/MEDQ   D:  06/15/2011  T:  06/15/2011  Job:  [id number]  Electronically Signed by Donalee Citrin M.D. on 07/23/2011 01:18:13 PM

## 2011-07-28 ENCOUNTER — Other Ambulatory Visit: Payer: Self-pay | Admitting: *Deleted

## 2011-07-28 MED ORDER — MELOXICAM 15 MG PO TABS: 15.0000 mg | ORAL_TABLET | Freq: Every day | ORAL | Status: DC

## 2011-08-04 ENCOUNTER — Telehealth: Payer: Self-pay | Admitting: *Deleted

## 2011-08-04 ENCOUNTER — Other Ambulatory Visit: Payer: Self-pay | Admitting: Neurosurgery

## 2011-08-04 DIAGNOSIS — R471 Dysarthria and anarthria: Secondary | ICD-10-CM

## 2011-08-04 NOTE — Telephone Encounter (Signed)
Per Dr. Diamantina Monks note of 9/26 gabapentin increased from 600 mg tid to ? 300 mg AM, Noon and 900 mg qhs - please verify with Dr. Elbert Ewings. That will be 5 tabs daily or 150 for the month - with prn refills

## 2011-08-04 NOTE — Telephone Encounter (Signed)
1. Patient requesting RF of gabapentin. Says MD she was seen by last increased her dosage. 2. Patient requesting lab order for ANA.

## 2011-08-05 NOTE — Telephone Encounter (Signed)
Pt was advised to take 3-300mg  tid for total of 9 pills per day until next OV with PCP - i clarified med on Med section here but did not send new rx

## 2011-08-05 NOTE — Discharge Summary (Signed)
  NAMEMarland Kitchen  MARGREE, GIMBEL NO.:  [id number]  MEDICAL RECORD NO.:  [id number]  LOCATION:  3528                         FACILITY:  [facility]  PHYSICIAN:  Donalee Citrin, M.D.        DATE OF BIRTH:  07-13-1946  DATE OF ADMISSION:  06/15/2011 DATE OF DISCHARGE:  06/16/2011                              DISCHARGE SUMMARY   ADMITTING DIAGNOSIS:  Cervical spondylosis with radiculopathy.  PROCEDURE:  Anterior cervical discectomy and fusion.  HOSPITAL COURSE:  The patient was admitted as an EMA, went to the operating room, underwent the aforementioned procedure.  On postop day #1, the patient is doing very well, ambulating and voiding spontaneously, tolerating a regular diet.  The patient will be discharged home and scheduled to follow up with Home Health in 2 weeks. At the time of discharge, the patient is neurologically stable.          ______________________________ Donalee Citrin, M.D.     GC/MEDQ  D:  07/23/2011  T:  07/23/2011  Job:  [id number]  Electronically Signed by Donalee Citrin M.D. on 08/05/2011 03:17:40 PM

## 2011-08-05 NOTE — Telephone Encounter (Signed)
Left mess to call office back to confirm what dose she is taking now. Lab entered.

## 2011-08-05 NOTE — Telephone Encounter (Signed)
Dr Felicity Coyer, is the below increase in gabapentin dosage correct?

## 2011-08-06 MED ORDER — GABAPENTIN 300 MG PO CAPS: 900.0000 mg | ORAL_CAPSULE | Freq: Three times a day (TID) | ORAL | Status: DC

## 2011-08-06 NOTE — Telephone Encounter (Signed)
Discussed with patient, RX sent in

## 2011-08-07 ENCOUNTER — Ambulatory Visit: Payer: Medicare Other

## 2011-08-07 DIAGNOSIS — D649 Anemia, unspecified: Secondary | ICD-10-CM

## 2011-08-07 LAB — HEMOGLOBIN: Hemoglobin: 11.8 g/dL — ABNORMAL LOW (ref 12.0–15.0)

## 2011-08-07 LAB — HEMATOCRIT: HCT: 35 % — ABNORMAL LOW (ref 36.0–46.0)

## 2011-08-08 LAB — RETICULOCYTES
RBC.: 3.97 MIL/uL (ref 3.87–5.11)
Retic Ct Pct: 2.4 % — ABNORMAL HIGH (ref 0.4–2.3)

## 2011-08-10 ENCOUNTER — Ambulatory Visit
Admission: RE | Admit: 2011-08-10 | Discharge: 2011-08-10 | Disposition: A | Discharge status: Home / Self Care | Payer: Medicare Other | Source: Ambulatory Visit | Attending: Neurosurgery | Admitting: Neurosurgery

## 2011-08-10 DIAGNOSIS — R471 Dysarthria and anarthria: Secondary | ICD-10-CM

## 2011-08-10 MED ORDER — GADOBENATE DIMEGLUMINE 529 MG/ML IV SOLN
16.0000 mL | Freq: Once | INTRAVENOUS | Status: AC | PRN
  Administered 2011-08-10: 16 mL via INTRAVENOUS

## 2011-08-12 ENCOUNTER — Telehealth: Payer: Self-pay | Admitting: *Deleted

## 2011-08-12 DIAGNOSIS — M79603 Pain in arm, unspecified: Secondary | ICD-10-CM

## 2011-08-12 NOTE — Telephone Encounter (Signed)
Pt informed of below. The request for ANA order was first mention on 08-04-11 telephone note but wasn't addressed. I called [facility] and spoke to [facility]- she states they do not have correct specimen to add ANA and pt will have to come back to lab. Ok to order ANA and advise pt to come for redraw?

## 2011-08-12 NOTE — Telephone Encounter (Signed)
Pt called requesting results of ANA and other labs that were done on 08-07-11. Please advise.

## 2011-08-12 NOTE — Telephone Encounter (Signed)
MN visit 9/13 - no mention of ordering ANA. VL visit 9/26 neither. Telephone msgs addressed gaba dosing.. Hgb check as stable 11.8g. Reticulocyte count to assess bone marrow function was normal.

## 2011-08-13 ENCOUNTER — Other Ambulatory Visit: Payer: Self-pay | Admitting: Neurosurgery

## 2011-08-13 DIAGNOSIS — M542 Cervicalgia: Secondary | ICD-10-CM

## 2011-08-13 NOTE — Telephone Encounter (Signed)
okey dokey. Dx - arm pain

## 2011-08-13 NOTE — Telephone Encounter (Signed)
Pt informed

## 2011-08-17 ENCOUNTER — Ambulatory Visit: Payer: Medicare Other

## 2011-08-17 DIAGNOSIS — M79603 Pain in arm, unspecified: Secondary | ICD-10-CM

## 2011-08-18 ENCOUNTER — Telehealth: Payer: Self-pay | Admitting: Internal Medicine

## 2011-08-18 LAB — ANA: Anti Nuclear Antibody(ANA): NEGATIVE

## 2011-08-18 NOTE — Telephone Encounter (Signed)
Hgb/hct normal.

## 2011-08-18 NOTE — Telephone Encounter (Signed)
Informed pt of hgb result. Pt is very upset that this lab has been drawn numerous times one being on Oct 12 and then again Oct 22

## 2011-08-18 NOTE — Telephone Encounter (Signed)
Please call [facility]inet  hgb normal

## 2011-08-19 ENCOUNTER — Telehealth: Payer: Self-pay | Admitting: Internal Medicine

## 2011-08-19 NOTE — Telephone Encounter (Signed)
Annotate lab letter - ANA negative. If letter already gone out please call patient. Thanks

## 2011-08-20 ENCOUNTER — Encounter: Payer: Self-pay | Admitting: Neurology

## 2011-08-20 ENCOUNTER — Ambulatory Visit (INDEPENDENT_AMBULATORY_CARE_PROVIDER_SITE_OTHER): Payer: Medicare Other | Admitting: Internal Medicine

## 2011-08-20 VITALS — BP 142/82 | HR 78 | Temp 97.8°F | Wt 181.0 lb

## 2011-08-20 DIAGNOSIS — M5010 Cervical disc disorder with radiculopathy, unspecified cervical region: Secondary | ICD-10-CM

## 2011-08-20 DIAGNOSIS — D509 Iron deficiency anemia, unspecified: Secondary | ICD-10-CM

## 2011-08-20 DIAGNOSIS — M5412 Radiculopathy, cervical region: Secondary | ICD-10-CM

## 2011-08-20 NOTE — Progress Notes (Signed)
  Subjective:    Patient ID: [name] [name], female    DOB: Mar 31, 1946, 65 y.o.   MRN: [id number]  HPI Review of labs for anemia work-up: iron stores Sept - normal range, Retic count October - normal range. Hgb has gone from 11.0 to 11.9.  NS - she has had MRI cervical spine - unremarkable.  EMG/NCS - negative. Dr. Wynetta Emery has suggested CT myelogram. She is still having weakness and pain in the right arm. This was the side that surgery was to originally address. Left sided UE pain is better. The pain in the right UE is constant but can be worse when supine. There is pain in the biceps.   I have reviewed the patient's medical history in detail and updated the computerized patient record.    Review of Systems System review is negative for any constitutional, cardiac, pulmonary, GI or neuro symptoms or complaints other than as described in the HPI.     Objective:   Physical Exam Vitals stable Gen'- WNWD AA woman in no acute distress HEENT - behind the left ear a small area of abrasions from shaving her head with minimal swelling and a small nodule c/w small post-auricular lymph node. Pulm - normal respirations Cor - RRR Neuro - Moves UEs normally, no muscle wasting, preserved ROM       Assessment & Plan:

## 2011-08-20 NOTE — Assessment & Plan Note (Signed)
Reviewed all labs: reticulocyte count - normal; iron stores low normal.Hgb has been stable and she reports no history of visible blood loss. Explained the mechanisms of iron deficiency anemia vs bone marrow suppression vs anemia of chronic disease. She was grateful for the information.  Plan - iron rich diet. She is directed to google or to [employer] .gov.

## 2011-08-20 NOTE — Assessment & Plan Note (Signed)
She continues to have UE pain, now predominantly right UE. She has had MRIs, neurosurgery with diskectomy, NCS/EMG that were unrevealing. Dr. Wynetta Emery has proposed doing a CT myelogram which she has deferred. Her sympotms persist with unclear etiology.  Plan- refer to Dr. Modesto Charon for neuro evaluation.

## 2011-09-04 ENCOUNTER — Encounter: Payer: Self-pay | Admitting: Neurology

## 2011-09-04 ENCOUNTER — Ambulatory Visit (INDEPENDENT_AMBULATORY_CARE_PROVIDER_SITE_OTHER): Payer: Medicare Other | Admitting: Neurology

## 2011-09-04 VITALS — BP 142/84 | HR 72 | Wt 183.0 lb

## 2011-09-04 DIAGNOSIS — G609 Hereditary and idiopathic neuropathy, unspecified: Secondary | ICD-10-CM

## 2011-09-04 DIAGNOSIS — G629 Polyneuropathy, unspecified: Secondary | ICD-10-CM

## 2011-09-04 NOTE — Progress Notes (Signed)
Dear Dr. Debby Bud,  Thank you for having me see [name] [name] in consultation today at [facility] for her problem with bilateral arm pain.  As you may recall, she is a 65 y.o. year old female with a history of possible lupus, breast cancer, pituitary tumor s/p TSR who began developing bilateral arm pain, worse on the right in April.  There was no provoking incident and the pain is described as stabbing or burning.  It first involved the arm above the wrist bilaterally, but the pain now involves the right hand.  She had a MRI of her cervical spine in May 2012 which revealed severe left and mild right C3-C4 foraminal stenosis, severe bilateral C5 foraminal stenosis, left C6 foraminal stenosis and mild to moderate canal stenosis at that level, severe left and moderate right C7 foraminal stenosis, and severe left and moderate right C8 foraminal stenosis.  She had a subsequent ACDF at C4-C5, C5-C6 on June 15, 2011.  She did have improvement of her left arm pain to some degree, but still has severe right arm pain.  In addition, she has noted numbness of the right lower face over the last several months and inner thighs bilaterally over the last several weeks.  She denies significant dry mouth but does get dry eyes.  She has had no significant joint pains.  She has chronic lower back pain and has had bilateral shooting pain in the thighs previously, but this is different then the burning pain she has in her medial thighs.  The pain in her right arm is not worse with neck movement.  Gabapentin has seemed to help somewhat.  Past Medical History  Diagnosis Date  . SLE (systemic lupus erythematosus)     Per Dr Corliss Skains  . Fibromyalgia     ?  [name] History of breast cancer   . HTN (hypertension)   . Chronic headaches   . DJD (degenerative joint disease)   . Anemia, iron deficiency     remote  . Depression   . Hx of adenomatous colonic polyps 11/2004  . GERD (gastroesophageal reflux disease)   .  Internal hemorrhoids   . LBP (low back pain)   . Spinal stenosis   . Esophageal stricture   . TMJ (temporomandibular joint syndrome)    Interestingly, she had a previous diagnosis of lupus, although the symptoms of what she was complaining is unclear.  She has had a recent ANA checked and this was negative. Patient also has an autoimmune alopecia.   Past Surgical History  Procedure Date  . Appendectomy   . Abdominal hysterectomy   . Breast lumpectomy     with a curative resection of malignancy  . Pituitary excision     via sphenoid approach  . Bunionectomy   . Eye surgery     Left  . Cervical spine surgery Jun 15, 2011    History   Social History  . Marital Status: Divorced    Spouse Name: N/A    Number of Children: N/A  . Years of Education: N/A   Occupational History  . Housing counselor/Trained paralegal    Social History Main Topics  . Smoking status: Never Smoker   . Smokeless tobacco: Never Used  . Alcohol Use: Yes  . Drug Use: None  . Sexually Active: None   Other Topics Concern  . None   Social History Narrative   Divorced after 26 years2 children; 1 son - '68; 1 dtr '65; 3 grandchildren  Family History  Problem Relation Age of Onset  . Hypertension Mother   . Arrhythmia Mother   . Gout Mother   . Thyroid disease Sister   . Diabetes Sister   . Hypertension Son   . Other Neg Hx     PITUITARY DISEASE  . Colon cancer Neg Hx    Current Outpatient Prescriptions on File Prior to Visit  Medication Sig Dispense Refill  . aspirin 81 MG EC tablet Take 81 mg by mouth daily.        . Cholecalciferol 1000 UNITS tablet Take 1,000 Units by mouth daily.        . Digestive Enzymes TABS Take by mouth daily.        [name] estradiol (ESTRACE) 2 MG tablet Take 0.5 tablets (1 mg total) by mouth daily.  45 tablet  1  . fish oil-omega-3 fatty acids 1000 MG capsule Take 1 g by mouth daily.        [name] gabapentin (NEURONTIN) 300 MG capsule Take 3 capsules (900 mg total) by  mouth 3 (three) times daily.  270 capsule  3  . hydrochlorothiazide (,MICROZIDE/HYDRODIURIL,) 12.5 MG capsule Take 1 capsule (12.5 mg total) by mouth daily.  30 capsule  6  . meloxicam (MOBIC) 15 MG tablet Take 1 tablet (15 mg total) by mouth daily.  90 tablet  1  . methimazole (TAPAZOLE) 10 MG tablet Take 1 tablet (10 mg total) by mouth daily.  30 tablet  6  . metoprolol tartrate (LOPRESSOR) 25 MG tablet Take 1 tablet (25 mg total) by mouth 2 (two) times daily.  60 tablet  11  . morphine (MS CONTIN) 15 MG 12 hr tablet Take 1 tablet (15 mg total) by mouth every 12 (twelve) hours. Fill on or after 09/03/11  60 tablet  0  . pantoprazole (PROTONIX) 40 MG tablet Take 40 mg by mouth daily.        . Probiotic Product (ACIDOPHILUS PROBIOTIC BLEND) CAPS Take by mouth daily.        . ranitidine (ZANTAC) 150 MG capsule Take 150 mg by mouth 2 (two) times daily.        [name] zolpidem (AMBIEN) 10 MG tablet Take 10 mg by mouth. Every 3rd night       . DISCONTD: morphine (MS CONTIN) 15 MG 12 hr tablet Take 1 tablet (15 mg total) by mouth every 12 (twelve) hours. Fill on or after 07/04/11  60 tablet  0  . DISCONTD: morphine (MS CONTIN) 15 MG 12 hr tablet Take 1 tablet (15 mg total) by mouth every 12 (twelve) hours. Fill on or after 08/03/11  60 tablet  0      ROS:  13 systems were reviewed and are notable for chronic lower back pain for which she is on long acting morphine.  All other review of systems are unremarkable.   Examination:  Filed Vitals:   09/04/11 1419  BP: 142/84  Pulse: 72  Weight: 183 lb (83.008 kg)     In general, well appearing older woman.  Cardiovascular: The patient has a regular rate and rhythm and no carotid bruits.  Fundoscopy:  Disks are flat. Vessel caliber within normal limits.  Mental status:   The patient is oriented to person, place and time. Recent and remote memory are intact. Attention span and concentration are normal. Language including repetition, naming, following  commands are intact. Fund of knowledge of current and historical events, as well as vocabulary are normal.  Cranial Nerves:  Pupils are equally round and reactive to light. Visual fields full to confrontation. Extraocular movements are intact without nystagmus. Facial sensation reveals decreased sensation over the right V3 area. Muscles of facial expression are symmetric. Hearing intact to bilateral finger rub. Tongue protrusion, uvula, palate midline.  Shoulder shrug intact  Motor:  The patient has normal bulk and tone, no pronator drift.  There are no adventitious movements.  I do not see pseudoathetosis.  5/5 muscle strength bilaterally.  Reflexes:   Biceps  Triceps Brachioradialis Knee Ankle  Right 2+  0  2+   2+ 2+  Left  0  2+  0   0 2+  Toes down  Coordination:  Normal finger to nose.     Patchy sensory loss, particularly distal to temperature and pin, bilateral arms.  Patch sensory loss distally in legs.  Gait and Station are normal.  Tandem gait is intact.  Romberg is negative  I reviewed the results of her follow up C-spine MRI s/p surgery.   It appears there is a degree of foraminal stenosis still present at C5-C6 and C6-C7.  However, these appear to be mainly on the left.   Impression: It would be very unusual to have the sudden onset of neuropathic pain in the bilateral arms, and the fact that it is in the right more than the left argues against the spine ever being the issue.  However, we cannot deny that the surgery did help the left side(which was much less severely affected symptomatically).  However, the appearance of sensory symptoms in the bilateral thighs and right face suggests a different process.  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     Recommendations: I am first going to get the notes from Dr. Lonie Peak and Dr. Fatima Sanger office.  In particular I need the  results of the EMG/NCS.  I am looking for abnormal SNAPs which should not be the case if her problems are all from radicular disease.  I also am interested in the serology that prompted Dr. Corliss Skains to diagnose her with SLE in the past.  I am going to start he patient on Lyrica for her neuropathic pain.  Once I get the old records I am probably going to reorder her EMG/NCS.   We will see the patient back in 4 weeks.  Thank you for having [place] see [name] [name] in consultation.  Feel free to contact me with any questions.  Lupita Raider Modesto Charon, MD [facility], [place] [address] [place], [place] [address] Phone: [phone] Fax: [phone].

## 2011-09-04 NOTE — Patient Instructions (Signed)
start with 1 tab once a day for 5 days, then increase to 1 tab twice a day for 5 days, then increase to 1 tab three times a day from then on.

## 2011-09-05 ENCOUNTER — Encounter: Payer: Self-pay | Admitting: Neurology

## 2011-09-05 MED ORDER — PREGABALIN 50 MG PO CAPS: ORAL_CAPSULE | ORAL | Status: DC

## 2011-09-08 ENCOUNTER — Ambulatory Visit: Payer: Medicare Other | Admitting: Neurology

## 2011-09-10 NOTE — Progress Notes (Signed)
Got EMG/NCS from [facility] - normal SNAPS in UE. Lupus diagnosis documentation has been purged by Dr. Wende Mott - if you could, could you set [name] [name] up for a repeat EMG/NCS of the right ue and right lower extremity with Dr. Anne Hahn.  ? a sensory ganglionopathy .  If you could tell [name] [name] we will hold off for now on labs until I get the results of the EMG/NCS that would be great.

## 2011-09-11 ENCOUNTER — Telehealth: Payer: Self-pay

## 2011-09-11 ENCOUNTER — Other Ambulatory Visit: Payer: Self-pay | Admitting: Neurology

## 2011-09-11 DIAGNOSIS — M5412 Radiculopathy, cervical region: Secondary | ICD-10-CM

## 2011-09-11 NOTE — Telephone Encounter (Signed)
Pt notified of appt for ncs/emg on 09/28/11 at 9:30am.  Also, she said the med Dr. Modesto Charon gave her is helping.  Dr. Modesto Charon aware.

## 2011-10-02 ENCOUNTER — Telehealth: Payer: Self-pay | Admitting: *Deleted

## 2011-10-02 ENCOUNTER — Ambulatory Visit: Payer: Medicare Other | Admitting: *Deleted

## 2011-10-02 MED ORDER — MORPHINE SULFATE CR 15 MG PO TB12: 15.0000 mg | ORAL_TABLET | Freq: Two times a day (BID) | ORAL | Status: DC

## 2011-10-02 NOTE — Telephone Encounter (Signed)
Ok for Rx s 

## 2011-10-02 NOTE — Telephone Encounter (Signed)
Pt requesting 3 month prescription for morphine

## 2011-10-02 NOTE — Telephone Encounter (Signed)
rx up for md to sign

## 2011-10-05 ENCOUNTER — Encounter: Payer: Self-pay | Admitting: Neurology

## 2011-10-05 ENCOUNTER — Ambulatory Visit (INDEPENDENT_AMBULATORY_CARE_PROVIDER_SITE_OTHER): Payer: Medicare Other | Admitting: Neurology

## 2011-10-05 VITALS — BP 126/82 | HR 72 | Wt 184.0 lb

## 2011-10-05 DIAGNOSIS — M5412 Radiculopathy, cervical region: Secondary | ICD-10-CM

## 2011-10-05 DIAGNOSIS — M5010 Cervical disc disorder with radiculopathy, unspecified cervical region: Secondary | ICD-10-CM

## 2011-10-05 MED ORDER — PREGABALIN 150 MG PO CAPS: ORAL_CAPSULE | ORAL | Status: DC

## 2011-10-05 NOTE — Progress Notes (Signed)
Dear Dr. Debby Bud,  I saw  [name] [name] back in [facility] for her problem with left arm pain, bilateral thigh numbness and facial numbness.  As you may recall, she is a 65 y.o. year old female with a history of mild spinal stenosis and severe multilevel foraminal disease who has had a C4-C6 ACDF in August 2012.  This relieve some of her left arm pain, but she was left with severe right arm pain.  She has a long history of right facial numbness.  She had just developed inner thigh numbness 3 weeks before.  Given her history of possible connective tissue disease I had some concern for a sensory ganglionopathy.  A EMG/NCS however just confirmed a C7-C8 radiculopathy, which fits with her severe stenosis at those levels on the right on her MRI.  SNAPs were normal, making a ganglionopathy less likely.    I did start her on Lyrica, 50mg  tid, but she accidentally took 150 tid.  She had no ill effects, and got significant pain relief(90% better).  She continues to complain of weakness in the arm.   Medical history, social history, and family history were reviewed and have not changed since the last clinic visit.  Current Outpatient Prescriptions on File Prior to Visit  Medication Sig Dispense Refill  . aspirin 81 MG EC tablet Take 81 mg by mouth daily.        . Cholecalciferol 1000 UNITS tablet Take 1,000 Units by mouth daily.        . Digestive Enzymes TABS Take by mouth daily.        [name] estradiol (ESTRACE) 2 MG tablet Take 0.5 tablets (1 mg total) by mouth daily.  45 tablet  1  . fish oil-omega-3 fatty acids 1000 MG capsule Take 1 g by mouth daily.        [name] gabapentin (NEURONTIN) 300 MG capsule Take 3 capsules (900 mg total) by mouth 3 (three) times daily.  270 capsule  3  . hydrochlorothiazide (,MICROZIDE/HYDRODIURIL,) 12.5 MG capsule Take 1 capsule (12.5 mg total) by mouth daily.  30 capsule  6  . methimazole (TAPAZOLE) 10 MG tablet Take 1 tablet (10 mg total) by mouth daily.  30 tablet   6  . metoprolol tartrate (LOPRESSOR) 25 MG tablet Take 1 tablet (25 mg total) by mouth 2 (two) times daily.  60 tablet  11  . morphine (MS CONTIN) 15 MG 12 hr tablet Take 1 tablet (15 mg total) by mouth every 12 (twelve) hours. Fill on or after 12/04/11  60 tablet  0  . pantoprazole (PROTONIX) 40 MG tablet Take 40 mg by mouth daily.        . pregabalin (LYRICA) 50 MG capsule increase to 1 caps three times a day as directed.  90 capsule  2  . Probiotic Product (ACIDOPHILUS PROBIOTIC BLEND) CAPS Take by mouth daily.        . meloxicam (MOBIC) 15 MG tablet Take 1 tablet (15 mg total) by mouth daily.  90 tablet  1  . ranitidine (ZANTAC) 150 MG capsule Take 150 mg by mouth 2 (two) times daily.        [name] zolpidem (AMBIEN) 10 MG tablet Take 10 mg by mouth. Every 3rd night       . DISCONTD: morphine (MS CONTIN) 15 MG 12 hr tablet Take 1 tablet (15 mg total) by mouth every 12 (twelve) hours. Fill on or after 07/04/11  60 tablet  0  . DISCONTD: morphine (  MS CONTIN) 15 MG 12 hr tablet Take 1 tablet (15 mg total) by mouth every 12 (twelve) hours. Fill on or after 08/03/11  60 tablet  0    Allergies  Allergen Reactions  . Propoxyphene N-Acetaminophen     ROS:  13 systems were reviewed and are notable for fatigue during the day.  All other review of systems are unremarkable.  Exam: . Filed Vitals:   10/05/11 1054  BP: 126/82  Pulse: 72  Weight: 184 lb (83.462 kg)    In general, well appearing older women.     Motor:  Normal bulk and tone, no drift and 5/5 muscle strength in the upper extremities.  Reflexes:  2+ RUE, 2+ left triceps 1+ biceps and BR.   Impression/Recs:  C7 and C8 right radiculopathy causing right arm pain.  I am not sure of the significance of the non-progressive facial numbness at this time and the bilateral thigh numbness seems to be better.  I do not find any concrete weakness on exam, but certainly the pain and radiculopathy could cause numbness.  She does not want to consider  surgery at this point in time or epidural steroid injections.  We are going to continue the Lyrica at 150 tid as it seems to be helping significantly.  She can try to wean herself off the gabapentin to see if she needs it.  We have given her the form for financial assistance for the Lyrica.   We will see the patient back in 3 months.  Lupita Raider Modesto Charon, MD [facility], [place]

## 2011-10-06 ENCOUNTER — Telehealth: Payer: Self-pay | Admitting: Neurology

## 2011-10-06 MED ORDER — PREGABALIN 150 MG PO CAPS: ORAL_CAPSULE | ORAL | Status: DC

## 2011-10-06 NOTE — Telephone Encounter (Signed)
Pt claimed pharmacy did not have Lyrica rx when she went to pick it up. I asked her to look for her paper rx. Pt became very agitated and hung up phone.

## 2011-10-09 ENCOUNTER — Encounter: Payer: Self-pay | Admitting: Neurology

## 2011-11-09 ENCOUNTER — Ambulatory Visit: Payer: Medicare Other | Admitting: Internal Medicine

## 2011-11-19 ENCOUNTER — Ambulatory Visit (INDEPENDENT_AMBULATORY_CARE_PROVIDER_SITE_OTHER): Payer: Medicare Other | Admitting: Internal Medicine

## 2011-11-19 ENCOUNTER — Encounter: Payer: Self-pay | Admitting: Internal Medicine

## 2011-11-19 DIAGNOSIS — M5412 Radiculopathy, cervical region: Secondary | ICD-10-CM

## 2011-11-19 DIAGNOSIS — I10 Essential (primary) hypertension: Secondary | ICD-10-CM

## 2011-11-19 DIAGNOSIS — M5010 Cervical disc disorder with radiculopathy, unspecified cervical region: Secondary | ICD-10-CM

## 2011-11-19 DIAGNOSIS — F329 Major depressive disorder, single episode, unspecified: Secondary | ICD-10-CM

## 2011-11-19 DIAGNOSIS — F3289 Other specified depressive episodes: Secondary | ICD-10-CM

## 2011-11-19 NOTE — Patient Instructions (Signed)
Many of your symptoms, tremor, poor coordination, spacy feeling, etc may definitely be coming from the gabapentin. Lyrica shares some of the same side-effects. Plan - taper off the gabapentin: take 2 300mg  tab twice a day for three days; take 1 300 mg tab twice a day for 3 days and then stop. If you still have a lot of symptoms will need to look at reducing the lyrica. You should continue your other medications.   The pain in your right shoulder area is disproportionate to the degree of arthritic change in the shoulder joint. Also, the surgery should have alleviated the nerve compression. You may have Reflex sympathetic distrophy syndrome causing your pain. I will run this by Dr. Modesto Charon prior to starting any additional medication.

## 2011-11-21 NOTE — Assessment & Plan Note (Signed)
BP Readings from Last 3 Encounters:  11/19/11 130/90  10/05/11 126/82  09/04/11 142/84   OK control on present regimen.

## 2011-11-21 NOTE — Progress Notes (Signed)
  Subjective:    Patient ID: [name] [name], female    DOB: 08-01-1946, 66 y.o.   MRN: [id number]  HPI [name] [name] presents for question of medication side affects including mental confusion, paresthesia, ataxia. She is taking moderate to high dose gabapentin and lyrica.  She also c/o on-going pain in the right arm which is severe, interferes with sleep. She denies frank weakness in the right UE. She is s/p cervical disk disease and follow-up studies reveal "hardware" to be in good position and no apparent nerve root compression. She has seen Dr. Modesto Charon for evaluation. She has had NCS done in [facility] to evaluate for neuropathy. Study report and Dr. Nash Dimmer office notes reviewed.  She continues on MS Contin without adverse effects.  Past Medical History  Diagnosis Date  . SLE (systemic lupus erythematosus)     Per Dr Corliss Skains  . Fibromyalgia     ?  [name] History of breast cancer   . HTN (hypertension)   . Chronic headaches   . DJD (degenerative joint disease)   . Anemia, iron deficiency     remote  . Depression   . Hx of adenomatous colonic polyps 11/2004  . GERD (gastroesophageal reflux disease)   . Internal hemorrhoids   . LBP (low back pain)   . Spinal stenosis   . Esophageal stricture   . TMJ (temporomandibular joint syndrome)    Past Surgical History  Procedure Date  . Appendectomy   . Abdominal hysterectomy   . Breast lumpectomy     with a curative resection of malignancy  . Pituitary excision     via sphenoid approach  . Bunionectomy   . Eye surgery     Left  . Cervical spine surgery Jun 15, 2011    Anterior cervical diskectomies and fusion at C4-5, C5-6   Family History  Problem Relation Age of Onset  . Hypertension Mother   . Arrhythmia Mother   . Gout Mother   . Thyroid disease Sister   . Diabetes Sister   . Hypertension Son   . Other Neg Hx     PITUITARY DISEASE  . Colon cancer Neg Hx   . Multiple sclerosis Daughter    History   Social History  .  Marital Status: Divorced    Spouse Name: N/A    Number of Children: N/A  . Years of Education: N/A   Occupational History  . Housing counselor/Trained paralegal    Social History Main Topics  . Smoking status: Never Smoker   . Smokeless tobacco: Never Used  . Alcohol Use: Yes  . Drug Use: Not on file  . Sexually Active: Not on file   Other Topics Concern  . Not on file   Social History Narrative   Divorced after 26 years2 children; 1 son - '68; 1 dtr '65; 3 grandchildren       Review of Systems System review is negative for any constitutional, cardiac, pulmonary, GI or neuro symptoms or complaints other than as described in the HPI.     Objective:   Physical Exam Filed Vitals:   11/19/11 1041  BP: 130/90  Pulse: 68  Temp: 97.4 F (36.3 C)   Gen'l - heavyset AA woman in no acute distress HEENT - C&S clear, EOMI, visual acuity grossly normal Cor- RRR Pulm- normal respirations. Neuro- A&O x 3, CN II-XII seems normal, normal motor strength, normal gait.         Assessment & Plan:

## 2011-11-21 NOTE — Assessment & Plan Note (Signed)
No change in symptoms. Her chronic pain and now complications that may be related to medication have added to her emotional burden. She does appear stable with no risk of self-harm.

## 2011-11-21 NOTE — Assessment & Plan Note (Signed)
Continued severe pain in the right UE although fairly functional. Question if her pain is RSD related.  Plan - due to side effects taper off gabapentin. Will continue Lyrica           Follow-up with Dr. Modesto Charon as instructed

## 2011-12-01 ENCOUNTER — Telehealth: Payer: Self-pay | Admitting: Internal Medicine

## 2011-12-01 NOTE — Telephone Encounter (Signed)
OK to continue taking the gabapentin at full dose. Please update med list.

## 2011-12-01 NOTE — Telephone Encounter (Signed)
The pt left a voice mail for triage stating her pain in her shoulder and back have increased.  She states she is not sleeping.  She is hoping to find out if Dr.Wong and Dr.Norins have another way to control pain.  She states when the Gabapentin (neurontin) was "almost out of her system" her pain came back.  She resumed taking the full dosage of the med, which did ease the pain some.  Thanks so much!    Call back - [phone]

## 2011-12-01 NOTE — Telephone Encounter (Signed)
Called the pt back and relayed this message, however, the pt was crying and to the point of being inconsolable.  After calming the pt down, I asked her if I mis-understood her this am, and she stated her hope was to ween off the Gabapentin due to adverse side affects.  After she regained composure, she asked to come in for an ov as soon as possible.  I added her to the 11am time slot, with the hope of moving her down to the 11:30 'acute' slot tomorrow am.  But I'm more than happy to move her if that will not work.   Thanks so much!

## 2011-12-02 ENCOUNTER — Encounter: Payer: Self-pay | Admitting: Internal Medicine

## 2011-12-02 ENCOUNTER — Ambulatory Visit (INDEPENDENT_AMBULATORY_CARE_PROVIDER_SITE_OTHER): Payer: Medicare Other | Admitting: Internal Medicine

## 2011-12-02 DIAGNOSIS — M5412 Radiculopathy, cervical region: Secondary | ICD-10-CM

## 2011-12-02 DIAGNOSIS — M5010 Cervical disc disorder with radiculopathy, unspecified cervical region: Secondary | ICD-10-CM

## 2011-12-02 NOTE — Patient Instructions (Signed)
Right arm pain - the working diagnosis per Dr. Modesto Charon is a C7-C8 radiculopathy on the right based on MRI evidence and the nerve conduction study. This is not RSD. Plan - will see about setting up epidural steroid injection to relieve your pain. \\ Patient Active Problem List  Diagnoses  . HYPERTHYROIDISM  . UNS D/O PITUITARY GLAND&ITS HYPOTHALAMIC CNTRL  . ANEMIA-IRON DEFICIENCY  . INSOMNIA, CHRONIC  . DEPRESSION  . HYPERTENSION  . ALLERGIC RHINITIS  . GERD  . POLYARTHRALGIA  . SPINAL STENOSIS OF LUMBAR REGION  . LOW BACK PAIN  . HAND PAIN, BILATERAL  . HYPERSOMNIA  . FATIGUE  . BREAST CANCER, HX OF  . COLONIC POLYPS, HX OF  . Cervical disc disorder with radiculopathy of cervical region    Epidural Steroid Injection An epidural steroid injection is given to relieve pain in the neck, back, or legs. This procedure involves injecting a steroid and numbing medicine (local anesthetic) into the epidural space. The epidural space is the space between the outer covering of the spinal cord and the vertebra. The epidural steroid injection helps in reducing the pain that is caused by the irritation or swelling of the nerve root. However, it does not cure the underlying problem. The injection may be given for the following conditions:  Changes in the soft, gel-like cushion between two vertebrae (disk) due to wear and tear.     A reduction in the space within the spinal canal.     Slipped or herniated disk.     Low back (lumbar) sprain.     Sciatica. This is shooting pain that radiates down the buttocks and the back of the leg due to compression of the nerve.     Traumatic compression fracture of the vertebra.     Pain that develops after a surgery of the spine.     Pain that arises after an attack of viral infection affecting the nerves (shingles).  LET YOUR CAREGIVER KNOW ABOUT:    Allergies to food or medicine.     Medicines taken, including vitamins, herbs, eyedrops, over-the-counter  medicines, and creams.     Use of steroids (by mouth or creams).     Previous problems with anesthetics or numbing medicines.     History of bleeding problems or blood clots.     Previous surgery.     Other health problems, including diabetes and kidney problems.     Possibility of pregnancy, if this applies.      RISKS AND COMPLICATIONS The complications due to the needle insertion are:  Headache.     Bleeding.    Infection.    Allergic reaction to the medicines.     Damage to the nerves.  The complications due to the steroid are:  Weight gain.     Hot flashes.     Mood swings.     Lack of sleep.     Increase in blood sugar levels, especially if you are diabetic.     Retention of water.  The response to this procedure depends on the underlying cause of the pain and its duration. Patients who have long-term (chronic) pain are less likely to benefit from epidural steroids than are patients whose pain comes on strong and suddenly. BEFORE THE PROCEDURE    The caregiver may ask about your symptoms, do a detailed exam, and advise some tests. These tests may include imaging studies. Your caregiver may review the results of your tests and discuss the procedure with you.  Ask your caregiver about changing or stopping your regular medicines. You may be advised to stop taking blood-thinning medicines a few days before the procedure.     You may also be given medicines to reduce your anxiety.  PROCEDURE   You will remain awake during the whole procedure. Although, you may receive medicine to make you sleepy. You will be asked to lie on your stomach. The site of the injection is cleansed. Then, the injection site is numbed using a small amount of medicine that numbs the area (local anesthetic). A hollow needle is directed through your skin into the epidural space with the help of an X-ray. The X-ray helps to ensure that the steroid is delivered closest to the affected nerve.  You may have some minimal discomfort at this time. Once the needle is in the right position, the local anesthetic and the steroid are injected into the epidural space. The needle is then removed. The skin is cleaned and a bandage is applied. The entire procedure takes only a few minutes, although repeated injections may be required (up to 3 to 4 injections over several weeks).   AFTER THE PROCEDURE    You may be monitored for a short time before you go home.     You may feel weakness or numbness in your arm or leg, which disappears within 1 to 2 hours.     Someone must drive you home or accompany you home if you are taking a taxi.     You may be allowed to eat, drink, and take your regular medicine.     Your pain may improve or worsen right after the procedure.     You may feel the beneficial effect of the steroid a few days later.     You may have soreness at the site of the injection.     If you have only partial relief of the pain, the injection may be repeated once or even twice within 4 to 8 weeks of the initial injection.  Document Released: 01/19/2008 Document Revised: 06/24/2011 Document Reviewed: 02/21/2009 [facility] Patient Information 2012 [place], [place].

## 2011-12-03 NOTE — Progress Notes (Signed)
  Subjective:    Patient ID: [name] [name], female    DOB: 24-Dec-1945, 66 y.o.   MRN: [id number]  HPI [name] [name] presents for continue refractory pain in the right arm. She feels it is worse having reduced the dose of gabapentin. She does not have RSD. Dr. Modesto Charon feels her pain is from cervical disk disease on the right, worse at C7-C8. Discussed this with her reviewing her MRI scans, the NCS recently done.  I have reviewed the patient's medical history in detail and updated the computerized patient record.    Review of Systems System review is negative for any constitutional, cardiac, pulmonary, GI or neuro symptoms or complaints other than as described in the HPI.     Objective:   Physical Exam Filed Vitals:   12/02/11 1106  BP: 140/88  Pulse: 71  Temp: 97.6 F (36.4 C)  Resp: 14   Gen'l - WNWD AA woman in great discomfort Pulm - normal Cor - normal Neuro - A&O x 3, normal movement of right arm, normal gait.        Assessment & Plan:

## 2011-12-03 NOTE — Assessment & Plan Note (Signed)
Continued arm pain - most likely from C7-C8 radiculopathy  Plan - will consult Dr. Modesto Charon re: advisability of ESI, if he agrees will contact IR at [facility].            Continue present meds.

## 2011-12-04 ENCOUNTER — Other Ambulatory Visit: Payer: Self-pay | Admitting: Internal Medicine

## 2011-12-04 DIAGNOSIS — M5010 Cervical disc disorder with radiculopathy, unspecified cervical region: Secondary | ICD-10-CM

## 2011-12-07 ENCOUNTER — Other Ambulatory Visit: Payer: Self-pay | Admitting: Internal Medicine

## 2011-12-07 DIAGNOSIS — M5010 Cervical disc disorder with radiculopathy, unspecified cervical region: Secondary | ICD-10-CM

## 2011-12-11 ENCOUNTER — Ambulatory Visit
Admission: RE | Admit: 2011-12-11 | Discharge: 2011-12-11 | Disposition: A | Discharge status: Home / Self Care | Payer: Medicare Other | Source: Ambulatory Visit | Attending: Internal Medicine | Admitting: Internal Medicine

## 2011-12-11 DIAGNOSIS — M5010 Cervical disc disorder with radiculopathy, unspecified cervical region: Secondary | ICD-10-CM

## 2011-12-11 MED ORDER — DIAZEPAM 5 MG PO TABS
5.0000 mg | ORAL_TABLET | Freq: Once | ORAL | Status: AC
  Administered 2011-12-11: 5 mg via ORAL

## 2011-12-11 NOTE — Discharge Instructions (Signed)

## 2011-12-17 ENCOUNTER — Other Ambulatory Visit: Payer: Self-pay | Admitting: *Deleted

## 2011-12-17 MED ORDER — PANTOPRAZOLE SODIUM 40 MG PO TBEC: 40.0000 mg | DELAYED_RELEASE_TABLET | Freq: Every day | ORAL | Status: DC

## 2012-01-06 ENCOUNTER — Telehealth: Payer: Self-pay | Admitting: Internal Medicine

## 2012-01-06 NOTE — Telephone Encounter (Signed)
The pt called and is hoping to have a cpe by the end of the month.  She states her medicine is running low and she would like to be seen as soon as possible.  Can this be worked in?  (the 20th and 21st you have 5 openings throughout the day)   Thanks!

## 2012-01-06 NOTE — Telephone Encounter (Signed)
You betcha. Any of those slots are fine

## 2012-01-07 ENCOUNTER — Telehealth: Payer: Self-pay | Admitting: Internal Medicine

## 2012-01-07 NOTE — Telephone Encounter (Signed)
The pt has scheduled her cpe for next week.  (she is very thankful Dr.Norins allowed her to come in without having an opening) however, now she is asking for a weeks worth of morphine 15mg  tabs.  During our first conversation she stated her medicine would run out at the end of the month, but now she is stating this med is out.    Thanks!

## 2012-01-07 NOTE — Telephone Encounter (Signed)
OK to issue 30 day supple (#60) with 3 scripts that she can pick up.

## 2012-01-13 ENCOUNTER — Ambulatory Visit (INDEPENDENT_AMBULATORY_CARE_PROVIDER_SITE_OTHER): Payer: Medicare Other | Admitting: Internal Medicine

## 2012-01-13 ENCOUNTER — Telehealth: Payer: Self-pay | Admitting: Internal Medicine

## 2012-01-13 ENCOUNTER — Encounter: Payer: Self-pay | Admitting: Internal Medicine

## 2012-01-13 VITALS — BP 138/86 | HR 78 | Temp 96.8°F | Resp 16 | Ht 66.0 in | Wt 193.2 lb

## 2012-01-13 DIAGNOSIS — M5412 Radiculopathy, cervical region: Secondary | ICD-10-CM

## 2012-01-13 DIAGNOSIS — E0590 Thyrotoxicosis, unspecified without thyrotoxic crisis or storm: Secondary | ICD-10-CM

## 2012-01-13 DIAGNOSIS — I10 Essential (primary) hypertension: Secondary | ICD-10-CM

## 2012-01-13 DIAGNOSIS — D509 Iron deficiency anemia, unspecified: Secondary | ICD-10-CM

## 2012-01-13 DIAGNOSIS — Z0000 Encounter for general adult medical examination without abnormal findings: Secondary | ICD-10-CM

## 2012-01-13 DIAGNOSIS — M48061 Spinal stenosis, lumbar region without neurogenic claudication: Secondary | ICD-10-CM

## 2012-01-13 DIAGNOSIS — M5010 Cervical disc disorder with radiculopathy, unspecified cervical region: Secondary | ICD-10-CM

## 2012-01-13 MED ORDER — MORPHINE SULFATE CR 15 MG PO TB12: 15.0000 mg | ORAL_TABLET | Freq: Two times a day (BID) | ORAL | Status: DC

## 2012-01-13 NOTE — Telephone Encounter (Signed)
Rx[3] printed awaiting MD signature.

## 2012-01-13 NOTE — Telephone Encounter (Signed)
OV 03.20.13-Rx[s] given to patient by MEN.

## 2012-01-13 NOTE — Telephone Encounter (Signed)
Faxed to [facility] office note from Feb. And ekg to [phone]. 3/20 emg

## 2012-01-13 NOTE — Progress Notes (Signed)
Subjective:    Patient ID: [name] [name], female    DOB: 1946/10/26, 66 y.o.   MRN: [id number]  HPI [name] [name] presents for annual wellness exam. She is going to have foot surgery on the left in about a week. She is seeing [name] [name] April 4th for pelvic/PAP and breast exam.   [name] [name] reports that since her ESI cervical spine she has been doing [facility] better and really feels  Much better than she has for some time.    The risk factors are reflected in the social history.  The roster of all physicians providing medical care to patient - is listed in the Snapshot section of the chart.  Activities of daily living:  The patient is 100% inedpendent in all ADLs: dressing, toileting, feeding as well as independent mobility  Home safety : The patient has smoke detectors in the home. Home is fall safe. They wear seatbelts. No firearms at home   There is no risks for hepatitis, STDs or HIV. There is no   history of blood transfusion. They have no travel history to infectious disease endemic areas of the world.  The patient has  seen their dentist in the last six month. They have  seen their eye doctor in the last year. They deny any hearing difficulty and have not had audiologic testing in the last year.  They do not  have excessive sun exposure. Discussed the need for sun protection: hats, long sleeves and use of sunscreen if there is significant sun exposure.   Diet: the importance of a healthy diet is discussed. They do have a healthy diet.  The patient has no regular exercise program.  The benefits of regular aerobic exercise were discussed.  Depression screen: there are no signs or vegative symptoms of depression- irritability, change in appetite, anhedonia, sadness/tearfullness.  Cognitive assessment: the patient manages all their financial and personal affairs and is actively engaged. They could relate day,date,year and events; recalled 3/3 objects at 3 minutes.  The following portions  of the patient's history were reviewed and updated as appropriate: allergies, current medications, past family history, past medical history,  past surgical history, past social history  and problem list.  Vision, hearing, body mass index were assessed and reviewed.   During the course of the visit the patient was educated and counseled about appropriate screening and preventive services including : fall prevention , diabetes screening, nutrition counseling, colorectal cancer screening, and recommended immunizations.  Past Medical History  Diagnosis Date  . SLE (systemic lupus erythematosus)     Per [name] [name]  . Fibromyalgia     ?  [name] History of breast cancer   . HTN (hypertension)   . Chronic headaches   . DJD (degenerative joint disease)   . Anemia, iron deficiency     remote  . Depression   . Hx of adenomatous colonic polyps 11/2004  . GERD (gastroesophageal reflux disease)   . Internal hemorrhoids   . LBP (low back pain)   . Spinal stenosis   . Esophageal stricture   . TMJ (temporomandibular joint syndrome)    Past Surgical History  Procedure Date  . Appendectomy   . Breast lumpectomy     with a curative resection of malignancy  . Pituitary excision     via sphenoid approach  . Bunionectomy   . Eye surgery     Left  . Cervical spine surgery Jun 15, 2011    Anterior cervical diskectomies and fusion at C4-5,  C5-6  . Abdominal hysterectomy     suspect myomectomy for fibroids with intact cervix in '10   Family History  Problem Relation Age of Onset  . Hypertension Mother   . Arrhythmia Mother   . Gout Mother   . Thyroid disease Sister   . Diabetes Sister   . Hypertension Son   . Other Neg Hx     PITUITARY DISEASE  . Colon cancer Neg Hx   . Multiple sclerosis Daughter    History   Social History  . Marital Status: Divorced    Spouse Name: N/A    Number of Children: N/A  . Years of Education: N/A   Occupational History  . Housing counselor/Trained  paralegal    Social History Main Topics  . Smoking status: Never Smoker   . Smokeless tobacco: Never Used  . Alcohol Use: Yes  . Drug Use: Not on file  . Sexually Active: Not on file   Other Topics Concern  . Not on file   Social History Narrative   Divorced after 26 years2 children; 1 son - '68; 1 dtr '65; 3 grandchildren      Review of Systems Constitutional:  Negative for fever, chills, activity change and unexpected weight change.  HEENT:  Negative for hearing loss, ear pain, congestion, neck stiffness and postnasal drip. Negative for sore throat or swallowing problems. Negative for dental complaints.   Eyes: Negative for vision loss or change in visual acuity.  Respiratory: Negative for chest tightness and wheezing. Negative for DOE. SOB resolved with cessation of gabapentin Cardiovascular: Negative for chest pain or palpitations. No decreased exercise tolerance Gastrointestinal: No change in bowel habit. No bloating or gas. No reflux or indigestion Genitourinary: Negative for urgency, frequency, flank pain and difficulty urinating.  Musculoskeletal: Positive for myalgias, back pain, arthralgias and gait problem. ESI cervical spine was helpful Neurological: Negative for dizziness, tremors, and headaches. Right arm weakness Hematological: Negative for adenopathy.  Psychiatric/Behavioral: Negative for behavioral problems and dysphoric mood.       Objective:   Physical Exam Filed Vitals:   01/13/12 0949  BP: 138/86  Pulse: 78  Temp: 96.8 F (36 C)  Resp: 16   Wt Readings from Last 3 Encounters:  01/13/12 193 lb 4 oz (87.658 kg)  12/02/11 190 lb 8 oz (86.41 kg)  11/19/11 188 lb (85.276 kg)   Gen'l- Mildly overweight AA woman who is in good spirits and no distress HEENT- EACs//TMs normal, oropharynx normal, posterior pharynx clear. Mild arcus senilis, C&SW clear, PERRLA Neck - supple, no thyromegaly Nodes - negative in the submandibular, cervical and supraclavicular  regions. Cor - 2+ radial and DP pulses; no JVD, no Carotid bruits. RRR Pulm - normal respirations. Breask exam deferred to gyn  Lab Results  Component Value Date   WBC 9.6 06/24/2011   HGB 11.9* 08/17/2011   HCT 35.2* 08/17/2011   PLT 337.0 06/24/2011   GLUCOSE 91 06/04/2011   CHOL 169 11/06/2010   TRIG 98.0 11/06/2010   HDL 78.70 11/06/2010   LDLCALC 71 11/06/2010   ALT 18 11/06/2010   AST 26 11/06/2010   NA 140 06/04/2011   K 4.1 06/04/2011   CL 102 06/04/2011   CREATININE 0.88 06/04/2011   BUN 12 06/04/2011   CO2 30 06/04/2011   TSH 0.98 06/24/2011        Assessment & Plan:

## 2012-01-14 DIAGNOSIS — Z0000 Encounter for general adult medical examination without abnormal findings: Secondary | ICD-10-CM | POA: Insufficient documentation

## 2012-01-14 MED ORDER — ESTRADIOL 2 MG PO TABS: 1.0000 mg | ORAL_TABLET | Freq: Every day | ORAL | Status: DC

## 2012-01-14 MED ORDER — PREGABALIN 150 MG PO CAPS: ORAL_CAPSULE | ORAL | Status: DC

## 2012-01-14 MED ORDER — METHIMAZOLE 10 MG PO TABS: 10.0000 mg | ORAL_TABLET | Freq: Every day | ORAL | Status: DC

## 2012-01-14 MED ORDER — HYDROCHLOROTHIAZIDE 12.5 MG PO CAPS: 12.5000 mg | ORAL_CAPSULE | Freq: Every day | ORAL | Status: DC

## 2012-01-14 MED ORDER — ESTRADIOL 0.1 MG/GM VA CREA: 2.0000 g | TOPICAL_CREAM | Freq: Every day | VAGINAL | Status: DC

## 2012-01-14 MED ORDER — MELOXICAM 15 MG PO TABS: 15.0000 mg | ORAL_TABLET | Freq: Every day | ORAL | Status: DC

## 2012-01-14 MED ORDER — METOPROLOL TARTRATE 25 MG PO TABS: 25.0000 mg | ORAL_TABLET | Freq: Two times a day (BID) | ORAL | Status: DC

## 2012-01-14 NOTE — Assessment & Plan Note (Signed)
BP Readings from Last 3 Encounters:  01/13/12 138/86  12/11/11 161/80  12/02/11 140/88   OK control. She will need to check BP out of the office - if running 140 + she will need to adjust medications

## 2012-01-14 NOTE — Assessment & Plan Note (Signed)
She has seen Dr. Modesto Charon in consultation. She has had ESI cervical spine area with very good results

## 2012-01-14 NOTE — Assessment & Plan Note (Signed)
Lab Results  Component Value Date   TSH 0.98 06/24/2011   OK control. Will need TSH Aug '13

## 2012-01-14 NOTE — Assessment & Plan Note (Signed)
Hemoglobin is stable.   

## 2012-01-14 NOTE — Assessment & Plan Note (Signed)
Interval history significant for Cervical disk disease with arm pain. Full work-up done and there was nerve compression post-op. She is better after ESI. Physical exam, sans breast and pelvic, OK. Labs - reviewed with no need for repeat labs at this time. She will be seeing her gynecologist very soon for pelvic and breast exam. She is current with colorectal and breast cancer screening.  In summary - a very nice woman who has multiple medical problems who fortunately, today, is doing well. She will return as needed. Refill Rx for chronic pain management provided -doing well on MS Contin and lyrica.

## 2012-01-14 NOTE — Assessment & Plan Note (Signed)
She has asked if she is a candidate for ESI lumbar region - this can be arranged when needed.

## 2012-01-20 ENCOUNTER — Other Ambulatory Visit: Payer: Self-pay

## 2012-01-20 MED ORDER — PANTOPRAZOLE SODIUM 40 MG PO TBEC: 40.0000 mg | DELAYED_RELEASE_TABLET | Freq: Every day | ORAL | Status: DC

## 2012-02-10 DIAGNOSIS — Z124 Encounter for screening for malignant neoplasm of cervix: Secondary | ICD-10-CM | POA: Insufficient documentation

## 2012-02-18 ENCOUNTER — Other Ambulatory Visit (INDEPENDENT_AMBULATORY_CARE_PROVIDER_SITE_OTHER): Payer: Medicare Other

## 2012-02-18 ENCOUNTER — Other Ambulatory Visit: Payer: Self-pay | Admitting: Internal Medicine

## 2012-02-18 DIAGNOSIS — D509 Iron deficiency anemia, unspecified: Secondary | ICD-10-CM

## 2012-02-18 DIAGNOSIS — Z0000 Encounter for general adult medical examination without abnormal findings: Secondary | ICD-10-CM

## 2012-02-18 DIAGNOSIS — E0590 Thyrotoxicosis, unspecified without thyrotoxic crisis or storm: Secondary | ICD-10-CM

## 2012-02-18 LAB — URINALYSIS, ROUTINE W REFLEX MICROSCOPIC
Bilirubin Urine: NEGATIVE
Nitrite: NEGATIVE
Specific Gravity, Urine: 1.01 (ref 1.000–1.030)
pH: 6 (ref 5.0–8.0)

## 2012-02-18 LAB — COMPREHENSIVE METABOLIC PANEL
ALT: 13 U/L (ref 0–35)
CO2: 30 mEq/L (ref 19–32)
Calcium: 9.4 mg/dL (ref 8.4–10.5)
Chloride: 103 mEq/L (ref 96–112)
GFR: 78.19 mL/min (ref >60.00)
Sodium: 140 mEq/L (ref 135–145)
Total Protein: 7 g/dL (ref 6.0–8.3)

## 2012-02-18 LAB — TSH: TSH: 1.7 u[IU]/mL (ref 0.35–5.50)

## 2012-02-18 LAB — CBC WITH DIFFERENTIAL/PLATELET
Basophils Absolute: 0.1 10*3/uL (ref 0.0–0.1)
Eosinophils Relative: 2.6 % (ref 0.0–5.0)
Hemoglobin: 12.3 g/dL (ref 12.0–15.0)
Lymphocytes Relative: 47.5 % — ABNORMAL HIGH (ref 12.0–46.0)
Monocytes Relative: 8.6 % (ref 3.0–12.0)
Neutro Abs: 2.5 10*3/uL (ref 1.4–7.7)
Platelets: 190 10*3/uL (ref 150.0–400.0)
RDW: 16.1 % — ABNORMAL HIGH (ref 11.5–14.6)
WBC: 6.1 10*3/uL (ref 4.5–10.5)

## 2012-03-09 ENCOUNTER — Telehealth: Payer: Self-pay | Admitting: *Deleted

## 2012-03-09 NOTE — Telephone Encounter (Signed)
Labs: all chemistries were normal - normal blood sugar at 77, normal kidney function and liver functions. Cholesterol 188, HDL 80, LDL 70 - this is fabulous.

## 2012-03-09 NOTE — Telephone Encounter (Signed)
Patient notified of normal lab results.

## 2012-03-09 NOTE — Telephone Encounter (Signed)
Patient would like to know her lab results. sue

## 2012-03-22 ENCOUNTER — Emergency Department (HOSPITAL_COMMUNITY)
Admission: EM | Admit: 2012-03-22 | Discharge: 2012-03-22 | Disposition: A | Discharge status: Home / Self Care | Payer: Medicare Other | Source: Home / Self Care | Attending: Emergency Medicine | Admitting: Emergency Medicine

## 2012-03-22 ENCOUNTER — Telehealth: Payer: Self-pay | Admitting: Internal Medicine

## 2012-03-22 ENCOUNTER — Encounter (HOSPITAL_COMMUNITY): Payer: Self-pay | Admitting: Emergency Medicine

## 2012-03-22 DIAGNOSIS — I82402 Acute embolism and thrombosis of unspecified deep veins of left lower extremity: Secondary | ICD-10-CM

## 2012-03-22 DIAGNOSIS — I82409 Acute embolism and thrombosis of unspecified deep veins of unspecified lower extremity: Secondary | ICD-10-CM

## 2012-03-22 MED ORDER — ENOXAPARIN SODIUM 150 MG/ML ~~LOC~~ SOLN: 1.0000 mg/kg | Freq: Once | SUBCUTANEOUS | Status: DC

## 2012-03-22 NOTE — ED Notes (Signed)
PT HERE WITH C/O LEFT BILAT ANKLE SWELLING BUT MORE IN L THAT RADIATES UP TO LEG LEG X 1WEEK AGO.PT S/P BUNIONECTOMY 3/25 WITH F/U SURGEON BUT STATES SHE CALLED PCP TODAY TO BE SEEN AND WAS TOLD TO COME HERE AND DONT DRIVE DUE TO NO APPT. TIMES.PT HAS BEEN TAKING ALREADY PRESCRIBED DIURETICS AND ELEVATION.DENIES PAIN OR SOB

## 2012-03-22 NOTE — Discharge Instructions (Signed)
Go to [facility] - non-invasive vascular lab tomorrrow at 8:00 a.m.

## 2012-03-22 NOTE — Telephone Encounter (Signed)
Caller: Alania/Mother; PCP: Illene Regulus; CB#: [phone]; ; ; Call regarding Leg Swelling;  Pt calling with c/o of leg/ ankle swelling since ~ 03/15/2012 .  Left  leg/ankle is more swollen. Pt did have Left bunionectomy 01/18/2012  and was told to expect swelling during recovery period , but has increased twice  the size it is normally. Has no pain, nor reddness.  RN reached See in 4 hrs DISP for new  marked swelling per Leg - Non injury protocol - NO EPIC appts seen for today - Kisha/ Team Lead adv to call office -- RN spoke with Fannie Knee and adv no appts  and to send to [facility]. RN adv pt to proceed to [facility] now, and she agreed.

## 2012-03-22 NOTE — ED Provider Notes (Signed)
Chief Complaint  Patient presents with  . Joint Swelling    History of Present Illness:  [name] [name] is a 66 year old female who had bunion surgery on March 25. She had some swelling of the foot which have been going on since the surgery, but this was getting better. She was taken out of the postop boot last week and over the past 24-48 hours has noted a swelling of her left ankle and lower leg as far as the knee. She denies any pain in the knee, calf, foot, or ankle. She denies any heat or redness. There is no swelling or pain above the knee and she denies any shortness of breath or chest pain. She's had no history of DVT or pulmonary embolus. She called her primary care Dr. and was referred here to have a DVT ruled out.  Review of Systems:  Other than noted above, the patient denies any of the following symptoms: Systemic:  No fever, chills, sweats, weight gain or loss. Respiratory:  No coughing, wheezing, or shortness of breath. Cardiac:  No chest pain, tightness, pressure or syncope. GI:  No abdominal pain, swelling, distension, nausea, or vomiting. GU:  No dysuria, frequency, or hematuria. Ext:  No joint pain, muscle pain, or weakness. Skin:  No rash or itching. Neuro:  No paresthesias.   PMFSH:  Past medical history, family history, social history, meds, and allergies were reviewed.  Physical Exam:   Vital signs:  BP 142/84  Pulse 82  Temp(Src) 98.6 F (37 C) (Oral)  Resp 18  SpO2 97% Gen:  Alert, oriented, in no distress. Neck:  No tenderness, adenopathy, or JVD. Lungs:  Breath sounds clear and equal bilaterally.  No rales, rhonchi or wheezes. Heart:  Regular rhythm, no gallops or murmers. Abdomen:  Soft, nontender, no organomegaly or mass. Ext:  She has pitting ankle edema of the left ankle. There's no obvious swelling of the calf and no redness or heat. There is no tenderness to palpation over the calf and Homans sign was negative. Calf circumference was 37 cm bilaterally. Neuro:   Alert and oriented times 3.  No muscle weakness.  Sensation intact to light touch. Skin:  Warm and dry.  No rash or skin lesions.  Medications given in UCC:  She was given Lovenox 90 mg subcutaneous and tolerated this well without any immediate side effects. She will be sent to the noninvasive vascular lab tomorrow morning at 8:00 for a venous Doppler. Results to be sent to Dr. Debby Bud.  Assessment:  The encounter diagnosis was DVT of leg (deep venous thrombosis), left.  Plan:   1.  The following meds were prescribed:   New Prescriptions   No medications on file   2.  The patient was instructed in symptomatic care and handouts were given. 3.  The patient was told to return if becoming worse in any way, if no better in 3 or 4 days, and given some red flag symptoms that would indicate earlier return.    Reuben Likes, MD 03/22/12 [phone]

## 2012-03-23 ENCOUNTER — Ambulatory Visit (HOSPITAL_COMMUNITY)
Admission: RE | Admit: 2012-03-23 | Discharge: 2012-03-23 | Disposition: A | Discharge status: Home / Self Care | Payer: Medicare Other | Source: Ambulatory Visit | Attending: Internal Medicine | Admitting: Internal Medicine

## 2012-03-23 ENCOUNTER — Telehealth: Payer: Self-pay | Admitting: Internal Medicine

## 2012-03-23 DIAGNOSIS — R609 Edema, unspecified: Secondary | ICD-10-CM

## 2012-03-23 NOTE — Progress Notes (Signed)
Bilateral lower extremity venous duplex completed.  Preliminary report is negative for DVT, SVT, or a Baker's cyst. 

## 2012-03-23 NOTE — Telephone Encounter (Signed)
We can try today or tomorrow

## 2012-03-23 NOTE — Telephone Encounter (Signed)
Pt went to [facility] yesterday per Call A Nurse.  She had a dopler today checking for blood clots. Test was negative for blood clots.  She still has a lot of swelling in her ankles.  Could she be worked in?

## 2012-03-23 NOTE — Telephone Encounter (Signed)
Pt is coming Thurs May 30.

## 2012-03-24 ENCOUNTER — Encounter: Payer: Self-pay | Admitting: Internal Medicine

## 2012-03-24 ENCOUNTER — Ambulatory Visit (INDEPENDENT_AMBULATORY_CARE_PROVIDER_SITE_OTHER): Payer: Medicare Other | Admitting: Internal Medicine

## 2012-03-24 VITALS — BP 132/82 | HR 76 | Temp 97.5°F | Resp 16 | Wt 199.0 lb

## 2012-03-24 DIAGNOSIS — I872 Venous insufficiency (chronic) (peripheral): Secondary | ICD-10-CM

## 2012-03-27 DIAGNOSIS — I872 Venous insufficiency (chronic) (peripheral): Secondary | ICD-10-CM | POA: Insufficient documentation

## 2012-03-27 NOTE — Progress Notes (Signed)
Subjective:    Patient ID: [name] [name], female    DOB: 1945-11-19, 66 y.o.   MRN: [id number]  HPI [name] [name] had called the office 03/22/12 - Call A Nurse referred to Urgent Care, Dr. Lorenz Coaster. She had an enlarged leg on exam. She was given a dose of Lovenox and set up for LE venous Doppler for 5/29 - negative study. She continues to have bilateral LE edema, left worse than right that does wane with being supine and wax when up. Chart reviewed: no significant renal disease, no h/o CHF. Her leg is not painful.  Past Medical History  Diagnosis Date  . SLE (systemic lupus erythematosus)     Per Dr Corliss Skains  . Fibromyalgia     ?  [name] History of breast cancer   . HTN (hypertension)   . Chronic headaches   . DJD (degenerative joint disease)   . Anemia, iron deficiency     remote  . Depression   . Hx of adenomatous colonic polyps 11/2004  . GERD (gastroesophageal reflux disease)   . Internal hemorrhoids   . LBP (low back pain)   . Spinal stenosis   . Esophageal stricture   . TMJ (temporomandibular joint syndrome)    Past Surgical History  Procedure Date  . Appendectomy   . Breast lumpectomy     with a curative resection of malignancy  . Pituitary excision     via sphenoid approach  . Bunionectomy   . Eye surgery     Left  . Cervical spine surgery Jun 15, 2011    Anterior cervical diskectomies and fusion at C4-5, C5-6  . Abdominal hysterectomy     suspect myomectomy for fibroids with intact cervix in '10   Family History  Problem Relation Age of Onset  . Hypertension Mother   . Arrhythmia Mother   . Gout Mother   . Thyroid disease Sister   . Diabetes Sister   . Hypertension Son   . Other Neg Hx     PITUITARY DISEASE  . Colon cancer Neg Hx   . Multiple sclerosis Daughter    History   Social History  . Marital Status: Divorced    Spouse Name: N/A    Number of Children: N/A  . Years of Education: N/A   Occupational History  . Housing counselor/Trained paralegal     Social History Main Topics  . Smoking status: Never Smoker   . Smokeless tobacco: Never Used  . Alcohol Use: Yes  . Drug Use: Not on file  . Sexually Active: Not on file   Other Topics Concern  . Not on file   Social History Narrative   Divorced after 26 years2 children; 1 son - '68; 1 dtr '65; 3 grandchildren       Review of Systems  Constitutional: Negative.   HENT: Negative.   Eyes: Negative.   Respiratory: Negative.   Cardiovascular: Positive for leg swelling.  Gastrointestinal: Negative.   Genitourinary: Negative.   Musculoskeletal: Negative.   Skin: Negative.   Neurological: Negative.   Hematological: Negative.   Psychiatric/Behavioral: Negative.        Objective:   Physical Exam Filed Vitals:   03/24/12 1359  BP: 132/82  Pulse: 76  Temp: 97.5 F (36.4 C)  Resp: 16   O2 sat normal Gen'l - overweight AA woman in no distress Cor- RRR, 2+ LE edema left, 1+ on the right. Pulm - normal respirations MSK - no deformity LE, no tenderness at the calves.  LE venous doppler reviewed: no DVT, Baker's cyst or other abnormality.        Assessment & Plan:

## 2012-03-27 NOTE — Assessment & Plan Note (Signed)
Persistent LE edema, L>R, that waxes and wanes with position c/w venous insufficiency  Plan Full explanation provided to patient  No indication for diuretics  OTC Support hosiery as a start. If no relief - Rx hosiery

## 2012-05-17 ENCOUNTER — Other Ambulatory Visit: Payer: Self-pay

## 2012-05-17 NOTE — Telephone Encounter (Signed)
Ok to prepare scripts

## 2012-05-17 NOTE — Telephone Encounter (Signed)
Pt called requesting 3 mth refills of MS Contin.

## 2012-05-18 ENCOUNTER — Other Ambulatory Visit: Payer: Self-pay | Admitting: *Deleted

## 2012-05-19 ENCOUNTER — Other Ambulatory Visit: Payer: Self-pay

## 2012-05-19 MED ORDER — MORPHINE SULFATE ER 15 MG PO TBCR: 15.0000 mg | EXTENDED_RELEASE_TABLET | Freq: Two times a day (BID) | ORAL | Status: DC

## 2012-05-19 NOTE — Telephone Encounter (Signed)
Rxs for ms contin printed. Patient to be notified when signed by Dr.Norins

## 2012-06-16 ENCOUNTER — Ambulatory Visit (INDEPENDENT_AMBULATORY_CARE_PROVIDER_SITE_OTHER): Payer: Medicare Other | Admitting: Internal Medicine

## 2012-06-16 ENCOUNTER — Encounter: Payer: Self-pay | Admitting: Internal Medicine

## 2012-06-16 VITALS — BP 126/80 | HR 63 | Temp 97.7°F | Resp 16 | Wt 201.0 lb

## 2012-06-16 DIAGNOSIS — R221 Localized swelling, mass and lump, neck: Secondary | ICD-10-CM

## 2012-06-16 DIAGNOSIS — R220 Localized swelling, mass and lump, head: Secondary | ICD-10-CM

## 2012-06-16 DIAGNOSIS — R635 Abnormal weight gain: Secondary | ICD-10-CM

## 2012-06-16 DIAGNOSIS — I10 Essential (primary) hypertension: Secondary | ICD-10-CM

## 2012-06-16 NOTE — Patient Instructions (Addendum)
Weight gain - lyrica vs other.  Plan - start with a 5 day calorie count: write down everything that passes your lips; throw away day 1&2, count up everything days 3-5. This will give [place] a better idea as to where to start re: getting your weight down.  Submandibular nodule right - present for almost a year. Lymph node vs salivary gland vs cyst.  Plan - CT neck with contrast to determine what we are dealing with.  Come in tomorrow or Monday for lab.

## 2012-06-17 ENCOUNTER — Other Ambulatory Visit (INDEPENDENT_AMBULATORY_CARE_PROVIDER_SITE_OTHER): Payer: Medicare Other

## 2012-06-17 DIAGNOSIS — I10 Essential (primary) hypertension: Secondary | ICD-10-CM

## 2012-06-17 LAB — COMPREHENSIVE METABOLIC PANEL
AST: 25 U/L (ref 0–37)
BUN: 13 mg/dL (ref 6–23)
Calcium: 9.7 mg/dL (ref 8.4–10.5)
Chloride: 104 mEq/L (ref 96–112)
Creatinine, Ser: 1 mg/dL (ref 0.4–1.2)

## 2012-06-19 ENCOUNTER — Encounter: Payer: Self-pay | Admitting: Internal Medicine

## 2012-06-19 NOTE — Progress Notes (Signed)
Subjective:    Patient ID: [name] [name], female    DOB: Sep 29, 1946, 66 y.o.   MRN: [id number]  HPI [name] [name] presents with a concern about weight gain. Since being on Lyrica she feels she has gained too much weight - none of her clothes fit her. She says she is not eating more. The medication has been helpful.  She has had a small right submandibular nodule for almost a year. It is only marginally larger, not painful. She has had no night sweats, other than her climacteric, no increased saliva, no other lymph node enlargement.  Past Medical History  Diagnosis Date  . SLE (systemic lupus erythematosus)     Per Dr Corliss Skains  . Fibromyalgia     ?  [name] History of breast cancer   . HTN (hypertension)   . Chronic headaches   . DJD (degenerative joint disease)   . Anemia, iron deficiency     remote  . Depression   . Hx of adenomatous colonic polyps 11/2004  . GERD (gastroesophageal reflux disease)   . Internal hemorrhoids   . LBP (low back pain)   . Spinal stenosis   . Esophageal stricture   . TMJ (temporomandibular joint syndrome)    Past Surgical History  Procedure Date  . Appendectomy   . Breast lumpectomy     with a curative resection of malignancy  . Pituitary excision     via sphenoid approach  . Bunionectomy   . Eye surgery     Left  . Cervical spine surgery Jun 15, 2011    Anterior cervical diskectomies and fusion at C4-5, C5-6  . Abdominal hysterectomy     suspect myomectomy for fibroids with intact cervix in '10   Family History  Problem Relation Age of Onset  . Hypertension Mother   . Arrhythmia Mother   . Gout Mother   . Thyroid disease Sister   . Diabetes Sister   . Hypertension Son   . Other Neg Hx     PITUITARY DISEASE  . Colon cancer Neg Hx   . Multiple sclerosis Daughter    History   Social History  . Marital Status: Divorced    Spouse Name: N/A    Number of Children: N/A  . Years of Education: N/A   Occupational History  . Housing  counselor/Trained paralegal    Social History Main Topics  . Smoking status: Never Smoker   . Smokeless tobacco: Never Used  . Alcohol Use: Yes  . Drug Use: Not on file  . Sexually Active: Not on file   Other Topics Concern  . Not on file   Social History Narrative   Divorced after 26 years2 children; 1 son - '68; 1 dtr '65; 3 grandchildren    Current Outpatient Prescriptions on File Prior to Visit  Medication Sig Dispense Refill  . aspirin 81 MG EC tablet Take 81 mg by mouth daily.        . Cholecalciferol 1000 UNITS tablet Take 1,000 Units by mouth daily.        . Digestive Enzymes TABS Take by mouth daily.        [name] estradiol (ESTRACE) 0.1 MG/GM vaginal cream Place 0.25 Applicatorfuls vaginally daily.  42.5 g  5  . estradiol (ESTRACE) 2 MG tablet Take 0.5 tablets (1 mg total) by mouth daily.  45 tablet  3  . fish oil-omega-3 fatty acids 1000 MG capsule Take 1 g by mouth daily.        [name]  hydrochlorothiazide (MICROZIDE) 12.5 MG capsule Take 1 capsule (12.5 mg total) by mouth daily.  30 capsule  6  . meloxicam (MOBIC) 15 MG tablet Take 1 tablet (15 mg total) by mouth daily.  90 tablet  3  . methimazole (TAPAZOLE) 10 MG tablet Take 1 tablet (10 mg total) by mouth daily.  30 tablet  11  . metoprolol tartrate (LOPRESSOR) 25 MG tablet Take 1 tablet (25 mg total) by mouth 2 (two) times daily.  60 tablet  11  . morphine (MS CONTIN) 15 MG 12 hr tablet Take 1 tablet (15 mg total) by mouth 2 (two) times daily. May fill on or after 06/19/2012  60 tablet  0  . morphine (MS CONTIN) 15 MG 12 hr tablet Take 1 tablet (15 mg total) by mouth 2 (two) times daily. May fill on or after 07/20/2012  60 tablet  0  . pantoprazole (PROTONIX) 40 MG tablet Take 1 tablet (40 mg total) by mouth daily.  30 tablet  5  . pregabalin (LYRICA) 150 MG capsule increase to 1 caps three times a day as directed.  90 capsule  5  . Probiotic Product (ACIDOPHILUS PROBIOTIC BLEND) CAPS Take by mouth daily.        [name] gabapentin  (NEURONTIN) 300 MG capsule Take 3 capsules (900 mg total) by mouth 3 (three) times daily.  270 capsule  3  . ranitidine (ZANTAC) 150 MG capsule Take 150 mg by mouth 2 (two) times daily.        [name] zolpidem (AMBIEN) 10 MG tablet Take 10 mg by mouth. Every 3rd night       . DISCONTD: morphine (MS CONTIN) 15 MG 12 hr tablet Take 1 tablet (15 mg total) by mouth every 12 (twelve) hours. Fill on or after 07/04/11  60 tablet  0  . DISCONTD: morphine (MS CONTIN) 15 MG 12 hr tablet Take 1 tablet (15 mg total) by mouth every 12 (twelve) hours. Fill on or after 08/03/11  60 tablet  0      Review of Systems System review is negative for any constitutional, cardiac, pulmonary, GI or neuro symptoms or complaints other than as described in the HPI.     Objective:   Physical Exam Filed Vitals:   06/16/12 1526  BP: 126/80  Pulse: 63  Temp: 97.7 F (36.5 C)  Resp: 16   Wt Readings from Last 3 Encounters:  06/16/12 201 lb (91.173 kg)  03/24/12 199 lb (90.266 kg)  01/13/12 193 lb 4 oz (87.658 kg)   Gen'l - WNWD moderately overweight AA woman in no acute distress HEENT - C&S clear Neck - full ROM. Small, 3-4 mm, nodule below the jawline on the right. The is a firm nodule, not tender, fixed. Cor= RRR Pulm - normal respirations and breath sounds.       Assessment & Plan:  1. Submandibular nodule - present for 10+ months w/o much growth.  Plan - CT neck w/ contrast to evaluate nodule  Bmet in order to have contrast material.  2. Weight management - she has gained 8 lbs over the past several months.  Plan  5 days calorie count with report back - recommendations to follow.

## 2012-06-20 ENCOUNTER — Ambulatory Visit (INDEPENDENT_AMBULATORY_CARE_PROVIDER_SITE_OTHER)
Admission: RE | Admit: 2012-06-20 | Discharge: 2012-06-20 | Disposition: A | Discharge status: Home / Self Care | Payer: Medicare Other | Source: Ambulatory Visit | Attending: Internal Medicine | Admitting: Internal Medicine

## 2012-06-20 DIAGNOSIS — R221 Localized swelling, mass and lump, neck: Secondary | ICD-10-CM

## 2012-06-20 DIAGNOSIS — R220 Localized swelling, mass and lump, head: Secondary | ICD-10-CM

## 2012-06-20 MED ORDER — IOHEXOL 300 MG/ML  SOLN
75.0000 mL | Freq: Once | INTRAMUSCULAR | Status: AC | PRN
  Administered 2012-06-20: 75 mL via INTRAVENOUS

## 2012-06-21 ENCOUNTER — Encounter: Payer: Self-pay | Admitting: Internal Medicine

## 2012-06-21 ENCOUNTER — Telehealth: Payer: Self-pay | Admitting: *Deleted

## 2012-06-21 NOTE — Telephone Encounter (Signed)
PATIENT NOTIFIED OF LAB RESULTS AND THAT CT SCAN REPORT IN THE MAIL TO HER TODAY.

## 2012-06-21 NOTE — Telephone Encounter (Signed)
Message copied by Elnora Morrison on Tue Jun 21, 2012 11:51 AM ------      Message from: Jacques Navy      Created: Sun Jun 19, 2012  6:18 PM       Call patient - normal lab. Kidney function is fine - ok for CT

## 2012-06-28 ENCOUNTER — Telehealth: Payer: Self-pay

## 2012-06-28 ENCOUNTER — Telehealth: Payer: Self-pay | Admitting: *Deleted

## 2012-06-28 NOTE — Telephone Encounter (Signed)
Pt is calling regarding Lyrica. Pt is requesting a return call from [facility] L[facility] ASAP.

## 2012-06-28 NOTE — Telephone Encounter (Signed)
Patient concern of side effects of lyrica medication and does not want to go back on. But is ex piercing tingling sensations on her legs/ arms and other areas of body as if something crawling on her to the point she scratches at it.which causes her not to get a good nite sleep. Patient also is concerned of area under her chin on jaw line . Received  Report of test that was done but still is concerned about this. It is still numb in that area. Please advise.

## 2012-06-29 ENCOUNTER — Other Ambulatory Visit: Payer: Self-pay | Admitting: *Deleted

## 2012-06-29 MED ORDER — DULOXETINE HCL 30 MG PO CPEP: 30.0000 mg | ORAL_CAPSULE | Freq: Every day | ORAL | Status: DC

## 2012-06-29 NOTE — Telephone Encounter (Signed)
Alternative to lyrica includes gabapentin which she was on and stopped; cymbalata can be tried if not already tried. If she is willing can give samples of 30 mg sig 1 qd.  The small nodule was benign on scan. Not sure why it would be numb. At this point no treatment seems indicated. If the nodule/gland enlarges or becomes painful can refer to ENT for excisional biopsy.

## 2012-06-29 NOTE — Telephone Encounter (Signed)
Patient notified of response per Dr. Debby Bud concerning Lyrica , Gabapentin,.explained could call in Cymbalata for her discomfort. Patient seemed very agitated about all this and states she knows what is  Wrong that she is having withdrawals from not having Lyrica since Wednesday and wanted to know how long was this going to last with her legs. Patient said to just call in the Cymbalata to the pharmacy. Tried to explain about her nodule. Patient hung up the phone. Will call cymbalata to patient pharmacy.

## 2012-08-12 ENCOUNTER — Other Ambulatory Visit: Payer: Self-pay | Admitting: Internal Medicine

## 2012-08-12 ENCOUNTER — Telehealth: Payer: Self-pay | Admitting: Internal Medicine

## 2012-08-12 MED ORDER — PANTOPRAZOLE SODIUM 40 MG PO TBEC: 40.0000 mg | DELAYED_RELEASE_TABLET | Freq: Every day | ORAL | Status: DC

## 2012-08-12 NOTE — Telephone Encounter (Signed)
Caller: [name] [name]/Patient; Patient Name: [name] [name]; PCP: Illene Regulus (Adults only); Best Callback Phone Number: [phone]  CALLER CALLING BACK TODAY FOR SECOND CALL REGARDING A REFILL FOR PROTONIX. CALLER IS VERY UPSET AND STATES SHE RAN OUT OF MED LAST WEEK,  her pharmacy submitted a refill request for med and pharmacy has not heard back from clinic.  Caller has been without med approx. a week and is starting to have some stomach discomfort. States OTC forms of med is "very expensive" and she cannot afford. CALLER IS REQUESTING REFILL FOR PROTONIX BE CALLED [facility] (pharmacy information in chart).  Advised will send additional request for med refill to Dr. Debby Bud.  Caller verbalized understanding and continued to verbalized how dissatisfied she is with the process of getting a medication refilled. States she has not had this problem before until August.  Triage nurse apologized fro inconvenience.

## 2012-08-12 NOTE — Telephone Encounter (Signed)
Patient needs a refill on Protonix sent to [facility], patient states that her pharmacy has been faxing for over a week for this refill

## 2012-08-12 NOTE — Telephone Encounter (Signed)
Please see previous note - pt advised.

## 2012-08-15 ENCOUNTER — Other Ambulatory Visit: Payer: Self-pay | Admitting: *Deleted

## 2012-08-15 MED ORDER — PANTOPRAZOLE SODIUM 40 MG PO TBEC: 40.0000 mg | DELAYED_RELEASE_TABLET | Freq: Every day | ORAL | Status: DC

## 2012-08-31 ENCOUNTER — Other Ambulatory Visit: Payer: Self-pay | Admitting: *Deleted

## 2012-08-31 MED ORDER — HYDROCHLOROTHIAZIDE 12.5 MG PO CAPS: 12.5000 mg | ORAL_CAPSULE | Freq: Every day | ORAL | Status: DC

## 2012-09-16 ENCOUNTER — Telehealth: Payer: Self-pay | Admitting: Internal Medicine

## 2012-09-16 MED ORDER — MORPHINE SULFATE ER 15 MG PO TBCR: 15.0000 mg | EXTENDED_RELEASE_TABLET | Freq: Two times a day (BID) | ORAL | Status: DC

## 2012-09-16 NOTE — Telephone Encounter (Signed)
Rxs printed and placed on side A counter for signature

## 2012-09-16 NOTE — Telephone Encounter (Signed)
Called pt and let her know that her rx's are ready for pick up.

## 2012-09-16 NOTE — Telephone Encounter (Signed)
Patient is requesting a refill on the morphine, call when ready to pick up  Pt says she made this request two weeks ago and never heard back

## 2012-09-16 NOTE — Telephone Encounter (Signed)
Ok for 30 day script x 3

## 2012-09-27 ENCOUNTER — Ambulatory Visit (INDEPENDENT_AMBULATORY_CARE_PROVIDER_SITE_OTHER): Payer: Medicare Other

## 2012-09-27 DIAGNOSIS — Z23 Encounter for immunization: Secondary | ICD-10-CM

## 2012-11-02 ENCOUNTER — Ambulatory Visit (INDEPENDENT_AMBULATORY_CARE_PROVIDER_SITE_OTHER): Payer: Medicare Other | Admitting: Internal Medicine

## 2012-11-02 ENCOUNTER — Encounter: Payer: Self-pay | Admitting: Internal Medicine

## 2012-11-02 VITALS — BP 130/88 | HR 80 | Temp 97.5°F | Resp 10 | Wt 191.0 lb

## 2012-11-02 DIAGNOSIS — M545 Low back pain, unspecified: Secondary | ICD-10-CM

## 2012-11-02 NOTE — Progress Notes (Signed)
  Subjective:    Patient ID: Chaney Ingram, female    DOB: [birth year], 67 y.o.   MRN: [id number]  HPI Three weeks ago she hurt her back with lifting. She has been taking mobic, heat and has increased her narcotics. Her legs also hurt. NO paresthesia, no weakness. She denies having any falls.  PMH, FamHx and SocHx reviewed for any changes and relevance. Current Outpatient Prescriptions on File Prior to Visit  Medication Sig Dispense Refill  . aspirin 81 MG EC tablet Take 81 mg by mouth daily.        . Cholecalciferol 1000 UNITS tablet Take 1,000 Units by mouth daily.        . Digestive Enzymes TABS Take by mouth daily.        [name] estradiol (ESTRACE) 0.1 MG/GM vaginal cream Place 0.25 Applicatorfuls vaginally daily.  42.5 g  5  . estradiol (ESTRACE) 2 MG tablet Take 0.5 tablets (1 mg total) by mouth daily.  45 tablet  3  . fish oil-omega-3 fatty acids 1000 MG capsule Take 1 g by mouth daily.        . hydrochlorothiazide (MICROZIDE) 12.5 MG capsule Take 1 capsule (12.5 mg total) by mouth daily.  30 capsule  11  . meloxicam (MOBIC) 15 MG tablet Take 1 tablet (15 mg total) by mouth daily.  90 tablet  3  . methimazole (TAPAZOLE) 10 MG tablet Take 1 tablet (10 mg total) by mouth daily.  30 tablet  11  . metoprolol tartrate (LOPRESSOR) 25 MG tablet Take 1 tablet (25 mg total) by mouth 2 (two) times daily.  60 tablet  11  . morphine (MS CONTIN) 15 MG 12 hr tablet Take 1 tablet (15 mg total) by mouth 2 (two) times daily. May fill on or after 11/15/2012  60 tablet  0  . pantoprazole (PROTONIX) 40 MG tablet Take 1 tablet (40 mg total) by mouth daily.  30 tablet  5  . Probiotic Product (ACIDOPHILUS PROBIOTIC BLEND) CAPS Take by mouth daily.              Review of Systems System review is negative for any constitutional, cardiac, pulmonary, GI or neuro symptoms or complaints other than as described in the HPI.     Objective:   Physical Exam Filed Vitals:   11/02/12 1645  BP: 130/88  Pulse: 80    Temp: 97.5 F (36.4 C)  Resp: 10   Cor- RRR  Pulm - normal respirations Back exam: normal stand; normal flex to greater than 100 degrees; normal gait; normal toe/heel walk; normal step up to exam table; normal SLR sitting; normal DTRs at the patellar tendons; normal sensation to light touch, pin-prick and deep vibratory stimulus; no  CVA tenderness; able to move supine to sitting witout assistance.        Assessment & Plan:  Low back pain - no radicular findings on exam.  Plan Supportive care: continue mobic, heat - try thermal patches, exercise.

## 2012-12-19 ENCOUNTER — Telehealth: Payer: Self-pay | Admitting: Internal Medicine

## 2012-12-19 NOTE — Telephone Encounter (Signed)
Ok for refills as requested.

## 2012-12-19 NOTE — Telephone Encounter (Signed)
Pt is requesting 3 mo refills on Morphine to take to [facility].  Please call when ready.

## 2012-12-20 ENCOUNTER — Other Ambulatory Visit: Payer: Self-pay | Admitting: *Deleted

## 2012-12-20 MED ORDER — MORPHINE SULFATE ER 15 MG PO TBCR: 15.0000 mg | EXTENDED_RELEASE_TABLET | Freq: Two times a day (BID) | ORAL | Status: DC

## 2012-12-21 NOTE — Telephone Encounter (Signed)
Pt is aware.  

## 2013-01-23 ENCOUNTER — Other Ambulatory Visit: Payer: Self-pay

## 2013-01-23 MED ORDER — METOPROLOL TARTRATE 25 MG PO TABS: 25.0000 mg | ORAL_TABLET | Freq: Two times a day (BID) | ORAL | Status: DC

## 2013-01-23 MED ORDER — ESTRADIOL 2 MG PO TABS: 1.0000 mg | ORAL_TABLET | Freq: Every day | ORAL | Status: DC

## 2013-01-23 MED ORDER — MELOXICAM 15 MG PO TABS: 15.0000 mg | ORAL_TABLET | Freq: Every day | ORAL | Status: DC

## 2013-02-07 ENCOUNTER — Other Ambulatory Visit: Payer: Self-pay | Admitting: Internal Medicine

## 2013-02-07 ENCOUNTER — Telehealth: Payer: Self-pay | Admitting: Internal Medicine

## 2013-02-07 NOTE — Telephone Encounter (Signed)
Pt is requesting new RX's for the following medications to send to [facility].  This is a new pharmacy for her.  Can we fax to them or will she need to pick them up and mail them? Hydrochlorothiazide Methimazole Meloxicam Panoprazole Morphine Estradiol Metoprolol Tartrate  Her phone is [phone]

## 2013-02-08 MED ORDER — PANTOPRAZOLE SODIUM 40 MG PO TBEC: 40.0000 mg | DELAYED_RELEASE_TABLET | Freq: Every day | ORAL | Status: DC

## 2013-02-08 MED ORDER — METHIMAZOLE 10 MG PO TABS: 10.0000 mg | ORAL_TABLET | Freq: Every day | ORAL | Status: DC

## 2013-02-08 MED ORDER — ESTRADIOL 2 MG PO TABS: 1.0000 mg | ORAL_TABLET | Freq: Every day | ORAL | Status: DC

## 2013-02-08 MED ORDER — METOPROLOL TARTRATE 25 MG PO TABS: 25.0000 mg | ORAL_TABLET | Freq: Two times a day (BID) | ORAL | Status: DC

## 2013-02-08 MED ORDER — HYDROCHLOROTHIAZIDE 12.5 MG PO CAPS: 12.5000 mg | ORAL_CAPSULE | Freq: Every day | ORAL | Status: DC

## 2013-02-08 MED ORDER — MELOXICAM 15 MG PO TABS: 15.0000 mg | ORAL_TABLET | Freq: Every day | ORAL | Status: DC

## 2013-02-08 NOTE — Telephone Encounter (Signed)
Ok to ref MS x one month. Pls sch OV Thx

## 2013-02-08 NOTE — Telephone Encounter (Signed)
All maintenance meds sent. Please advise on Morphine Rf.

## 2013-02-09 ENCOUNTER — Telehealth: Payer: Self-pay | Admitting: Internal Medicine

## 2013-02-09 MED ORDER — MORPHINE SULFATE ER 15 MG PO TBCR: 15.0000 mg | EXTENDED_RELEASE_TABLET | Freq: Two times a day (BID) | ORAL | Status: DC

## 2013-02-09 NOTE — Telephone Encounter (Signed)
Rx printed/signed/upfront for p/u. Called pt- spoke to a female who states she isn't available. Will have schedulers contact pt for OV.

## 2013-02-09 NOTE — Telephone Encounter (Signed)
Called pt to schedule an appt with Plot. Pt stated she will call back to schedule an appt, pt will out of town.

## 2013-02-13 ENCOUNTER — Telehealth: Payer: Self-pay | Admitting: *Deleted

## 2013-02-13 NOTE — Telephone Encounter (Signed)
Message copied by Merrilyn Puma on Mon Feb 13, 2013 11:37 AM ------      Message from: Livingston Diones      Created: Thu Feb 09, 2013 10:07 AM       Called pt to schedule an appt with AVP. Pt stated she will call back to schedule an appt later bc she will be out of town.       ----- Message -----         From: Merrilyn Puma, CMA         Sent: 02/09/2013   9:49 AM           To: Livingston Diones, Burnett Harry            Please contact pt- she needs OV per AVP. I guess we can see her since MEN is out or she can wait until MEN returns. I refilled her pain med x 1 month per AVP.             Thanks!       ------

## 2013-04-14 ENCOUNTER — Ambulatory Visit (INDEPENDENT_AMBULATORY_CARE_PROVIDER_SITE_OTHER): Payer: Medicare Other | Admitting: Internal Medicine

## 2013-04-14 ENCOUNTER — Other Ambulatory Visit (INDEPENDENT_AMBULATORY_CARE_PROVIDER_SITE_OTHER): Payer: Medicare Other

## 2013-04-14 ENCOUNTER — Encounter: Payer: Self-pay | Admitting: Internal Medicine

## 2013-04-14 VITALS — BP 160/110 | HR 63 | Temp 97.0°F | Ht 68.0 in | Wt 189.8 lb

## 2013-04-14 DIAGNOSIS — M2550 Pain in unspecified joint: Secondary | ICD-10-CM

## 2013-04-14 DIAGNOSIS — I10 Essential (primary) hypertension: Secondary | ICD-10-CM

## 2013-04-14 DIAGNOSIS — M329 Systemic lupus erythematosus, unspecified: Secondary | ICD-10-CM

## 2013-04-14 LAB — URINALYSIS
Bilirubin Urine: NEGATIVE
Ketones, ur: NEGATIVE
Leukocytes, UA: NEGATIVE
Nitrite: NEGATIVE
Specific Gravity, Urine: 1.01 (ref 1.000–1.030)
Total Protein, Urine: NEGATIVE
Urine Glucose: NEGATIVE
Urobilinogen, UA: 0.2 (ref 0.0–1.0)
pH: 6 (ref 5.0–8.0)

## 2013-04-14 LAB — COMPREHENSIVE METABOLIC PANEL
ALT: 11 U/L (ref 0–35)
AST: 15 U/L (ref 0–37)
Albumin: 3.8 g/dL (ref 3.5–5.2)
Alkaline Phosphatase: 83 U/L (ref 39–117)
BUN: 17 mg/dL (ref 6–23)
CO2: 26 mEq/L (ref 19–32)
Calcium: 9.6 mg/dL (ref 8.4–10.5)
Chloride: 101 mEq/L (ref 96–112)
Creatinine, Ser: 1 mg/dL (ref 0.4–1.2)
GFR: 72.44 mL/min (ref >60.00)
Glucose, Bld: 100 mg/dL — ABNORMAL HIGH (ref 70–99)
Potassium: 3.8 mEq/L (ref 3.5–5.1)
Sodium: 137 mEq/L (ref 135–145)
Total Bilirubin: 0.6 mg/dL (ref 0.3–1.2)
Total Protein: 7.4 g/dL (ref 6.0–8.3)

## 2013-04-14 LAB — CBC
HCT: 35.7 % — ABNORMAL LOW (ref 36.0–46.0)
Hemoglobin: 12.2 g/dL (ref 12.0–15.0)
Platelets: 215 10*3/uL (ref 150.0–400.0)

## 2013-04-14 MED ORDER — HYDROCHLOROTHIAZIDE 25 MG PO TABS: 25.0000 mg | ORAL_TABLET | Freq: Every day | ORAL | Status: DC

## 2013-04-14 MED ORDER — MORPHINE SULFATE ER 15 MG PO TBCR: EXTENDED_RELEASE_TABLET | ORAL | Status: DC

## 2013-04-14 NOTE — Patient Instructions (Signed)

## 2013-04-14 NOTE — Progress Notes (Signed)
Subjective:    Patient ID: [name] [name], female    DOB: [birth year], 67 y.o.   MRN: [id number]  HPI  Pt presents to the clinic today to discuss her BP. She was diagnosed with HTN 2 years ago. She is currently on HCTZ and lopressor. Her BP has ranged from normal to 150/90. She has been experiencing headaches and worsening peripheral edema over the last 2 weeks. She did report that she has never been told that she has high BP. It does run in her family. She does consume some salt in her diet. She denies chest pain, chest tightness or shortness of breath.  Additionally, she needs a hard copy of her MS Contin to send to her mail order to be filled in June. 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 She has not had any complications from the medication and is tolerating it well without side effect.  Review of Systems      Past Medical History  Diagnosis Date  . SLE (systemic lupus erythematosus)     Per Dr Corliss Skains  . Fibromyalgia     ?  [name] History of breast cancer   . HTN (hypertension)   . Chronic headaches   . DJD (degenerative joint disease)   . Anemia, iron deficiency     remote  . Depression   . Hx of adenomatous colonic polyps 11/2004  . GERD (gastroesophageal reflux disease)   . Internal hemorrhoids   . LBP (low back pain)   . Spinal stenosis   . Esophageal stricture   . TMJ (temporomandibular joint syndrome)     Current Outpatient Prescriptions  Medication Sig Dispense Refill  . aspirin 81 MG EC tablet Take 81 mg by mouth daily.        . Cholecalciferol 1000 UNITS tablet Take 1,000 Units by mouth daily.        . Digestive Enzymes TABS Take by mouth daily.        [name] estradiol (ESTRACE) 0.1 MG/GM vaginal cream Place 0.25 Applicatorfuls vaginally daily.  42.5 g  5  . estradiol (ESTRACE) 2 MG tablet Take 0.5 tablets (1 mg total) by mouth daily.  90 tablet  1  . fish oil-omega-3 fatty acids 1000 MG capsule Take 1 g by mouth daily.        . hydrochlorothiazide (MICROZIDE) 12.5 MG  capsule Take 1 capsule (12.5 mg total) by mouth daily.  90 capsule  1  . meloxicam (MOBIC) 15 MG tablet Take 1 tablet (15 mg total) by mouth daily.  90 tablet  1  . methimazole (TAPAZOLE) 10 MG tablet Take 1 tablet (10 mg total) by mouth daily.  90 tablet  1  . metoprolol tartrate (LOPRESSOR) 25 MG tablet Take 1 tablet (25 mg total) by mouth 2 (two) times daily.  180 tablet  1  . morphine (MS CONTIN) 15 MG 12 hr tablet Take 1 tablet (15 mg total) by mouth 2 (two) times daily. May fill on or after 02/13/2013  60 tablet  0  . pantoprazole (PROTONIX) 40 MG tablet Take 1 tablet (40 mg total) by mouth daily.  90 tablet  1  . Probiotic Product (ACIDOPHILUS PROBIOTIC BLEND) CAPS Take by mouth daily.         No current facility-administered medications for this visit.    Allergies  Allergen Reactions  . Percocet (Oxycodone-Acetaminophen) Other (See Comments)    hallucinations  . Propoxyphene-Acetaminophen Other (See Comments)    hallucinations    Family History  Problem Relation Age  of Onset  . Hypertension Mother   . Arrhythmia Mother   . Gout Mother   . Thyroid disease Sister   . Diabetes Sister   . Hypertension Son   . Other Neg Hx     PITUITARY DISEASE  . Colon cancer Neg Hx   . Multiple sclerosis Daughter     History   Social History  . Marital Status: Divorced    Spouse Name: N/A    Number of Children: N/A  . Years of Education: N/A   Occupational History  . Housing counselor/Trained paralegal    Social History Main Topics  . Smoking status: Never Smoker   . Smokeless tobacco: Never Used  . Alcohol Use: Yes  . Drug Use: Not on file  . Sexually Active: Not on file   Other Topics Concern  . Not on file   Social History Narrative   Divorced after 26 years   2 children; 1 son - '68; 1 dtr '65; 3 grandchildren     Constitutional: Pt reports headache. Denies fever, malaise, fatigue, or abrupt weight changes.  HEENT: Denies eye pain, eye redness, ear pain, ringing  in the ears, wax buildup, runny nose, nasal congestion, bloody nose, or sore throat.  Cardiovascular: Denies chest pain, chest tightness, palpitations or swelling in the hands or feet.  Musculoskeletal: Pt reports multiple joint pain. Denies decrease in range of motion, difficulty with gait, muscle pain or joint swelling.   Neurological: Denies dizziness, difficulty with memory, difficulty with speech or problems with balance and coordination.   No other specific complaints in a complete review of systems (except as listed in HPI above).  Objective:   Physical Exam   BP 160/110  Pulse 63  Temp(Src) 97 F (36.1 C) (Oral)  Ht 5\' 8"  (1.727 m)  Wt 189 lb 12.8 oz (86.093 kg)  BMI 28.87 kg/m2  SpO2 98% Wt Readings from Last 3 Encounters:  04/14/13 189 lb 12.8 oz (86.093 kg)  11/02/12 191 lb (86.637 kg)  06/16/12 201 lb (91.173 kg)    General: Appears her stated age, well developed, well nourished in NAD. HEENT: Head: normal shape and size; Eyes: sclera white, no icterus, conjunctiva pink, PERRLA and EOMs intact; Ears: Tm's gray and intact, normal light reflex; Nose: mucosa pink and moist, septum midline; Throat/Mouth: Teeth present, mucosa pink and moist, no exudate, lesions or ulcerations noted.  Cardiovascular: Normal rate and rhythm. S1,S2 noted.  No murmur, rubs or gallops noted. No JVD or BLE edema. No carotid bruits noted. Pulmonary/Chest: Normal effort and positive vesicular breath sounds. No respiratory distress. No wheezes, rales or ronchi noted.  Musculoskeletal: Normal range of motion. No signs of joint swelling. No difficulty with gait.  Neurological: Alert and oriented. Cranial nerves II-XII intact. Coordination normal. +DTRs bilaterally.  BMET    Component Value Date/Time   NA 140 06/17/2012 1654   K 4.3 06/17/2012 1654   CL 104 06/17/2012 1654   CO2 28 06/17/2012 1654   GLUCOSE 90 06/17/2012 1654   BUN 13 06/17/2012 1654   CREATININE 1.0 06/17/2012 1654   CALCIUM 9.7  06/17/2012 1654   GFRNONAA >60 06/04/2011 1100   GFRAA >60 06/04/2011 1100    Lipid Panel     Component Value Date/Time   CHOL 188 02/18/2012 1033   TRIG 191.0* 02/18/2012 1033   HDL 80.00 02/18/2012 1033   CHOLHDL 2 02/18/2012 1033   VLDL 38.2 02/18/2012 1033   LDLCALC 70 02/18/2012 1033    CBC  Component Value Date/Time   WBC 6.1 02/18/2012 1033   RBC 4.07 02/18/2012 1033   HGB 12.3 02/18/2012 1033   HCT 35.9* 02/18/2012 1033   PLT 190.0 02/18/2012 1033   MCV 88.1 02/18/2012 1033   MCH 29.1 06/04/2011 1100   MCHC 34.2 02/18/2012 1033   RDW 16.1* 02/18/2012 1033   LYMPHSABS 2.9 02/18/2012 1033   MONOABS 0.5 02/18/2012 1033   EOSABS 0.2 02/18/2012 1033   BASOSABS 0.1 02/18/2012 1033    Hgb A1C No results found for this basename: HGBA1C        Assessment & Plan:

## 2013-04-14 NOTE — Assessment & Plan Note (Addendum)
Elevated today On HCTZ and lopresser. Will increase HCTZ to 25 mg daily Will check labs as well  RTC in 2 weeks for BP check

## 2013-04-14 NOTE — Assessment & Plan Note (Signed)
Refilled MS Contin for July no earlier than 05/12/2013

## 2013-04-17 LAB — ANA: Anti Nuclear Antibody(ANA): NEGATIVE

## 2013-04-24 ENCOUNTER — Telehealth: Payer: Self-pay

## 2013-04-24 DIAGNOSIS — I10 Essential (primary) hypertension: Secondary | ICD-10-CM

## 2013-04-24 NOTE — Telephone Encounter (Signed)
Phone call from patient stating her Hydrochlorothiazide was recently increased and she is now fatigue and having cramps in her calf muscles. She also states she has been eating bananas. Please advise. Thanks

## 2013-04-24 NOTE — Telephone Encounter (Signed)
Phone call to patient letting her know our lab hours are M-F 7:30-5:30 for her to come get her labs drawn and the order is already in the computer. She states she will come tomorrow.   She states her BP this morning at  9:20 am was 154/82 and at 9:45 141/75 she states it has been she just does not have those readings with her.

## 2013-04-24 NOTE — Telephone Encounter (Signed)
1. Needs to come to lab to check potassium and magnesium levels.  2. How is BP on increased dose.

## 2013-04-25 ENCOUNTER — Other Ambulatory Visit (INDEPENDENT_AMBULATORY_CARE_PROVIDER_SITE_OTHER): Payer: Medicare Other

## 2013-04-25 ENCOUNTER — Ambulatory Visit (INDEPENDENT_AMBULATORY_CARE_PROVIDER_SITE_OTHER): Payer: Medicare Other

## 2013-04-25 DIAGNOSIS — I10 Essential (primary) hypertension: Secondary | ICD-10-CM

## 2013-04-25 LAB — COMPREHENSIVE METABOLIC PANEL
AST: 15 U/L (ref 0–37)
Albumin: 3.9 g/dL (ref 3.5–5.2)
Alkaline Phosphatase: 86 U/L (ref 39–117)
BUN: 18 mg/dL (ref 6–23)
Potassium: 5.1 mEq/L (ref 3.5–5.1)
Total Bilirubin: 0.6 mg/dL (ref 0.3–1.2)

## 2013-04-27 ENCOUNTER — Telehealth: Payer: Self-pay | Admitting: Internal Medicine

## 2013-04-27 NOTE — Telephone Encounter (Signed)
Patient called and given lab results - all normal

## 2013-04-27 NOTE — Telephone Encounter (Signed)
Pt called request lab result that was done 04/25/13. Pt stated the nurse told her they will call ASAP. Please advise

## 2013-04-30 ENCOUNTER — Encounter: Payer: Self-pay | Admitting: Internal Medicine

## 2013-05-04 ENCOUNTER — Encounter: Payer: Self-pay | Admitting: Internal Medicine

## 2013-05-04 ENCOUNTER — Ambulatory Visit (INDEPENDENT_AMBULATORY_CARE_PROVIDER_SITE_OTHER): Payer: Medicare Other | Admitting: Internal Medicine

## 2013-05-04 VITALS — BP 134/80 | HR 69 | Temp 97.2°F | Ht 68.0 in | Wt 188.0 lb

## 2013-05-04 DIAGNOSIS — Z0000 Encounter for general adult medical examination without abnormal findings: Secondary | ICD-10-CM

## 2013-05-04 DIAGNOSIS — I10 Essential (primary) hypertension: Secondary | ICD-10-CM

## 2013-05-04 NOTE — Patient Instructions (Addendum)
Blood pressure - good control at this time on the present doses of medication. Continue the same.  Breast cancer screening -check with Dr. Henderson Cloud  Bone density - it is a good idea. You can get a DEXA scan here, perhaps at Dr. Kittie Plater office or any radiology center. Prevention/bone health: calcium intake should be 1200 mg daily (diet and supplement) and Vit D 800 - 1,000 iu daily.  Cholesterol was checked last year: total cholesterol 188 (nl 200 or less) HDL 80 ( normal 40+), LDL 70 (normal 130 or less). You do not need to have this rechecked for 2-3 years.  Eyes - you have been checked by your own eye doctor.   I am delighted that you are doing well.

## 2013-05-04 NOTE — Progress Notes (Signed)
  Subjective:    Patient ID: [name] [name], female    DOB: 1946/06/02, 67 y.o.   MRN: [id number]  HPI [name] [name] presents for f/u BP- she saw [name] [name] in June for elevation and had her HCT increased to 25 mg daily. Home readings show good control. BP today is fine.  In-home evaluation by NP thru insurance coverage recommend DEXA, Mammog and glaucoma. She has seen her eye doctor, she has had mammogram in 2012 and maybe later at Dr. Kittie Plater office. She is due for DEXA.  She inquires about the need for TB skin testing. She is not working in a high exposure risk setting and is asymptomatic with no known exposure. Testing not needed.  PMH, FamHx and SocHx reviewed for any changes and relevance.  Current Outpatient Prescriptions on File Prior to Visit  Medication Sig Dispense Refill  . aspirin 81 MG EC tablet Take 81 mg by mouth daily.        . Cholecalciferol 1000 UNITS tablet Take 1,000 Units by mouth daily.        [name] estradiol (ESTRACE) 0.1 MG/GM vaginal cream Place 0.25 Applicatorfuls vaginally daily.  42.5 g  5  . estradiol (ESTRACE) 2 MG tablet Take 0.5 tablets (1 mg total) by mouth daily.  90 tablet  1  . fish oil-omega-3 fatty acids 1000 MG capsule Take 1 g by mouth daily.        . hydrochlorothiazide (HYDRODIURIL) 25 MG tablet Take 1 tablet (25 mg total) by mouth daily.  30 tablet  0  . meloxicam (MOBIC) 15 MG tablet Take 1 tablet (15 mg total) by mouth daily.  90 tablet  1  . methimazole (TAPAZOLE) 10 MG tablet Take 1 tablet (10 mg total) by mouth daily.  90 tablet  1  . metoprolol tartrate (LOPRESSOR) 25 MG tablet Take 1 tablet (25 mg total) by mouth 2 (two) times daily.  180 tablet  1  . morphine (MS CONTIN) 15 MG 12 hr tablet May fill on or after 05/12/2013  60 tablet  0  . pantoprazole (PROTONIX) 40 MG tablet Take 1 tablet (40 mg total) by mouth daily.  90 tablet  1  . Probiotic Product (ACIDOPHILUS PROBIOTIC BLEND) CAPS Take by mouth daily.         No current  facility-administered medications on file prior to visit.      Review of Systems System review is negative for any constitutional, cardiac, pulmonary, GI or neuro symptoms or complaints other than as described in the HPI.     Objective:   Physical Exam Filed Vitals:   05/04/13 1024  BP: 134/80  Pulse: 69  Temp: 97.2 F (36.2 C)   BP Readings from Last 3 Encounters:  05/04/13 134/80  04/14/13 160/110  11/02/12 130/88   Gen'l- WNWD AA woman in no distress HEENT- C&S clear Cor - RRR Pulm - normal respirations Neuro - A&O x 3, normal gait.       Assessment & Plan:

## 2013-05-06 NOTE — Assessment & Plan Note (Signed)
In-home eval by [employer] NP reviewed. She is current with mammography and is current with her eye doctor. She is due for DEXA and can arrange this thru her gyn or here at her convenience.

## 2013-05-06 NOTE — Assessment & Plan Note (Signed)
BP Readings from Last 3 Encounters:  05/04/13 134/80  04/14/13 160/110  11/02/12 130/88   Doing well on current regimen with increased diuretic.

## 2013-05-09 ENCOUNTER — Other Ambulatory Visit: Payer: Self-pay

## 2013-05-09 DIAGNOSIS — I10 Essential (primary) hypertension: Secondary | ICD-10-CM

## 2013-05-09 MED ORDER — METHIMAZOLE 10 MG PO TABS: 10.0000 mg | ORAL_TABLET | Freq: Every day | ORAL | Status: DC

## 2013-05-09 MED ORDER — MELOXICAM 15 MG PO TABS: 15.0000 mg | ORAL_TABLET | Freq: Every day | ORAL | Status: DC

## 2013-05-09 MED ORDER — ESTRADIOL 2 MG PO TABS: 1.0000 mg | ORAL_TABLET | Freq: Every day | ORAL | Status: DC

## 2013-05-09 MED ORDER — PANTOPRAZOLE SODIUM 40 MG PO TBEC: 40.0000 mg | DELAYED_RELEASE_TABLET | Freq: Every day | ORAL | Status: DC

## 2013-05-09 MED ORDER — HYDROCHLOROTHIAZIDE 25 MG PO TABS: 25.0000 mg | ORAL_TABLET | Freq: Every day | ORAL | Status: DC

## 2013-05-09 MED ORDER — METOPROLOL TARTRATE 25 MG PO TABS: 25.0000 mg | ORAL_TABLET | Freq: Two times a day (BID) | ORAL | Status: DC

## 2013-07-13 ENCOUNTER — Telehealth: Payer: Self-pay

## 2013-07-13 DIAGNOSIS — M2550 Pain in unspecified joint: Secondary | ICD-10-CM

## 2013-07-13 MED ORDER — MORPHINE SULFATE ER 15 MG PO TBCR: 15.0000 mg | EXTENDED_RELEASE_TABLET | Freq: Two times a day (BID) | ORAL | Status: DC

## 2013-07-13 NOTE — Telephone Encounter (Signed)
k

## 2013-07-13 NOTE — Telephone Encounter (Signed)
Scripts have been mailed.

## 2013-07-13 NOTE — Telephone Encounter (Signed)
Phone call from patient requesting scripts be printed for 3 months for MS contin to be mailed to her so she can mail them to [employer].  Please advise.

## 2013-07-13 NOTE — Telephone Encounter (Signed)
rx printed and at the front desk

## 2013-07-13 NOTE — Telephone Encounter (Signed)
Scripts for MS Contin have been mailed to patient.

## 2013-08-24 ENCOUNTER — Ambulatory Visit (INDEPENDENT_AMBULATORY_CARE_PROVIDER_SITE_OTHER): Payer: Medicare Other

## 2013-08-24 DIAGNOSIS — Z23 Encounter for immunization: Secondary | ICD-10-CM

## 2013-09-27 ENCOUNTER — Telehealth: Payer: Self-pay | Admitting: *Deleted

## 2013-09-27 NOTE — Telephone Encounter (Signed)
Last OV July '14. Will need an OV per our office protocol re: controlled substance prescribing.

## 2013-09-27 NOTE — Telephone Encounter (Signed)
Unable to contact pt, no answer. 

## 2013-09-27 NOTE — Telephone Encounter (Signed)
Pt called requesting Morphine refill for 3 months.  Please advise

## 2013-09-27 NOTE — Telephone Encounter (Signed)
Spoke with pt advised of MDs message 

## 2013-10-11 ENCOUNTER — Ambulatory Visit (INDEPENDENT_AMBULATORY_CARE_PROVIDER_SITE_OTHER): Payer: Medicare Other | Admitting: Internal Medicine

## 2013-10-11 ENCOUNTER — Encounter: Payer: Self-pay | Admitting: Internal Medicine

## 2013-10-11 VITALS — BP 144/90 | HR 82 | Temp 97.5°F | Wt 187.8 lb

## 2013-10-11 DIAGNOSIS — K219 Gastro-esophageal reflux disease without esophagitis: Secondary | ICD-10-CM

## 2013-10-11 DIAGNOSIS — G894 Chronic pain syndrome: Secondary | ICD-10-CM

## 2013-10-11 DIAGNOSIS — Z23 Encounter for immunization: Secondary | ICD-10-CM

## 2013-10-11 DIAGNOSIS — M2550 Pain in unspecified joint: Secondary | ICD-10-CM

## 2013-10-11 DIAGNOSIS — I10 Essential (primary) hypertension: Secondary | ICD-10-CM

## 2013-10-11 MED ORDER — MORPHINE SULFATE ER 15 MG PO TBCR: 15.0000 mg | EXTENDED_RELEASE_TABLET | Freq: Two times a day (BID) | ORAL | Status: DC

## 2013-10-11 MED ORDER — LOSARTAN POTASSIUM-HCTZ 100-25 MG PO TABS: 1.0000 | ORAL_TABLET | Freq: Every day | ORAL | Status: DC

## 2013-10-11 NOTE — Patient Instructions (Signed)
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 Plan To confirm diagnosis will have you see Dr. Michiel Sites - a sportsmedicine specialist who can do in-office ultrasound for diagnosis.  2. Stomach irritation - likely to be the Mobic as culprit drug. Plan Stop mobic for several weeks to allow for stomach healing  Continue protonix  In the interval take generic tylenol 500 mg, 2 tablets 3 times a day  3. Blood pressure - a little high today but you report even higher readings at home Plan Stop the metoprolol  Start new product: losartan/hct  100mg /25 mg once a day  Blood pressure checks - you can send me readings, use mychart or make office visit.  You will need lab work after being on Losartan/hct for 4-6 weeks - order is in the system, just show up at the lab.  4. Immunizations - Prevnar today a companion pneumonia vaccine to pneumovax  Please check on coverage for shingles vaccine (Zostavax) this is important to have.  5. Pain management - Refills today for 3 months. Plan Transfer of care to Dr. Posey Rea in light of my up-coming retirement.

## 2013-10-11 NOTE — Progress Notes (Signed)
Pre visit review using our clinic review tool, if applicable. No additional management support is needed unless otherwise documented below in the visit note. 

## 2013-10-12 ENCOUNTER — Ambulatory Visit: Payer: Medicare Other | Admitting: Internal Medicine

## 2013-10-13 ENCOUNTER — Telehealth: Payer: Self-pay | Admitting: Internal Medicine

## 2013-10-13 NOTE — Telephone Encounter (Signed)
Message copied by Etheleen Sia on Fri Oct 13, 2013  9:58 AM ------      Message from: Illene Regulus E      Created: Thu Oct 12, 2013  7:04 AM                   ----- Message -----         From: Tresa Garter, MD         Sent: 10/11/2013   5:42 PM           To: Jacques Navy, MD            OK      Thank you!      AP      ----- Message -----         From: Jacques Navy, MD         Sent: 10/11/2013   3:42 PM           To: Tresa Garter, MD, Etheleen Sia            Please accept Ms.Berne as a patient in light of my retirement., She has been a stable chronic pain patient - reliable and compliant. I have had no problems with her. She does have an interesting history.             I am not on her [facility] list so I cannot see her after Jan 1, so that is a good time for transition. I will ask Harriett Sine to set up a January appointment.            Thank you        ------

## 2013-10-13 NOTE — Telephone Encounter (Signed)
Pt is aware that Dr. Posey Rea will be her PCP.  She will make an appt at a later date.

## 2013-10-14 DIAGNOSIS — G894 Chronic pain syndrome: Secondary | ICD-10-CM | POA: Insufficient documentation

## 2013-10-14 NOTE — Progress Notes (Signed)
Subjective:    Patient ID: [name] [name], female    DOB: 11-27-1945, 67 y.o.   MRN: [id number]  HPI [name] [name] presents for follow up.   There has been a problem as to why I am not in her [facility] program.  The thenar eminence of the right hand has been enlarging. It is not painful nor does it limit activity.  GI - symptoms similar to gastritis which she has had in the past. She does take NSAID - mobic. This may be the culprit drug.  Immunizations - due for prevnar and zostavax.  Past Medical History  Diagnosis Date  . SLE (systemic lupus erythematosus)     Per Dr Corliss Skains  . Fibromyalgia     ?  [name] History of breast cancer   . HTN (hypertension)   . Chronic headaches   . DJD (degenerative joint disease)   . Anemia, iron deficiency     remote  . Depression   . Hx of adenomatous colonic polyps 11/2004  . GERD (gastroesophageal reflux disease)   . Internal hemorrhoids   . LBP (low back pain)   . Spinal stenosis   . Esophageal stricture   . TMJ (temporomandibular joint syndrome)    Past Surgical History  Procedure Laterality Date  . Appendectomy    . Breast lumpectomy      with a curative resection of malignancy  . Pituitary excision      via sphenoid approach  . Bunionectomy    . Eye surgery      Left  . Cervical spine surgery  Jun 15, 2011    Anterior cervical diskectomies and fusion at C4-5, C5-6  . Abdominal hysterectomy      suspect myomectomy for fibroids with intact cervix in '10   Family History  Problem Relation Age of Onset  . Hypertension Mother   . Arrhythmia Mother   . Gout Mother   . Thyroid disease Sister   . Diabetes Sister   . Hypertension Son   . Other Neg Hx     PITUITARY DISEASE  . Colon cancer Neg Hx   . Multiple sclerosis Daughter    History   Social History  . Marital Status: Divorced    Spouse Name: N/A    Number of Children: N/A  . Years of Education: N/A   Occupational History  . Housing counselor/Trained paralegal     Social History Main Topics  . Smoking status: Never Smoker   . Smokeless tobacco: Never Used  . Alcohol Use: Yes  . Drug Use: Not on file  . Sexual Activity: Not on file   Other Topics Concern  . Not on file   Social History Narrative   Divorced after 26 years   2 children; 1 son - '68; 1 dtr '65; 3 grandchildren    Current Outpatient Prescriptions on File Prior to Visit  Medication Sig Dispense Refill  . aspirin 81 MG EC tablet Take 81 mg by mouth daily.        . Cholecalciferol 1000 UNITS tablet Take 1,000 Units by mouth daily.        [name] estradiol (ESTRACE) 0.1 MG/GM vaginal cream Place 0.25 Applicatorfuls vaginally daily.  42.5 g  5  . estradiol (ESTRACE) 2 MG tablet Take 0.5 tablets (1 mg total) by mouth daily.  90 tablet  1  . fish oil-omega-3 fatty acids 1000 MG capsule Take 1 g by mouth daily.        . hydrochlorothiazide (  HYDRODIURIL) 25 MG tablet Take 1 tablet (25 mg total) by mouth daily.  90 tablet  1  . meloxicam (MOBIC) 15 MG tablet Take 1 tablet (15 mg total) by mouth daily.  90 tablet  1  . methimazole (TAPAZOLE) 10 MG tablet Take 1 tablet (10 mg total) by mouth daily.  90 tablet  1  . metoprolol tartrate (LOPRESSOR) 25 MG tablet Take 1 tablet (25 mg total) by mouth 2 (two) times daily.  180 tablet  1  . pantoprazole (PROTONIX) 40 MG tablet Take 1 tablet (40 mg total) by mouth daily.  90 tablet  1  . Probiotic Product (ACIDOPHILUS PROBIOTIC BLEND) CAPS Take by mouth daily.         No current facility-administered medications on file prior to visit.      Review of Systems System review is negative for any constitutional, cardiac, pulmonary, GI or neuro symptoms or complaints other than as described in the HPI.     Objective:   Physical Exam Filed Vitals:   10/11/13 1453  BP: 144/90  Pulse: 82  Temp: 97.5 F (36.4 C)   Gen'l - WNWD  Cor - RRR Pulm - normal respirations Abd - mild tenderness epigastrium, nl BS Neuro - awake and alert MSK - swollen  right thenar eminence w/o tenderness, fixed nodule, limitation in ROM thumb.       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 Plan Refer to Dr. Katrinka Blazing for diagnostic evaluation, e.g. U/S hand  Recommendations to follow.

## 2013-10-14 NOTE — Assessment & Plan Note (Signed)
Blood pressure - a little high today but you report even higher readings at home Plan Stop the metoprolol  Start new product: losartan/hct  100mg /25 mg once a day  Blood pressure checks - you can send me readings, use mychart or make office visit.  You will need lab work after being on Losartan/hct for 4-6 weeks - order is in the system, just show up at the lab.

## 2013-10-14 NOTE — Assessment & Plan Note (Signed)
Stomach irritation - likely to be the Mobic as culprit drug. Plan Stop mobic for several weeks to allow for stomach healing  Continue protonix  In the interval take generic tylenol 500 mg, 2 tablets 3 times a day

## 2013-10-14 NOTE — Assessment & Plan Note (Signed)
Pain management - Refills today for 3 months. Plan Transfer of care to Dr. Posey Rea in light of my up-coming retirement.

## 2013-10-16 ENCOUNTER — Ambulatory Visit: Payer: Medicare Other | Admitting: Family Medicine

## 2013-10-18 ENCOUNTER — Other Ambulatory Visit: Payer: Self-pay

## 2013-10-18 DIAGNOSIS — I10 Essential (primary) hypertension: Secondary | ICD-10-CM

## 2013-10-18 MED ORDER — HYDROCHLOROTHIAZIDE 25 MG PO TABS: 25.0000 mg | ORAL_TABLET | Freq: Every day | ORAL | Status: DC

## 2013-11-01 ENCOUNTER — Telehealth: Payer: Self-pay | Admitting: Internal Medicine

## 2013-11-01 NOTE — Telephone Encounter (Signed)
Called patient and these were the readings   169/82 @6 :30pm....   149/79 @10 :30am ..[name].. 150/78 @11 :00am ...153/79 @11 :20am

## 2013-11-01 NOTE — Telephone Encounter (Signed)
Please call patient and get her to relay her BP readings

## 2013-11-01 NOTE — Telephone Encounter (Signed)
Patient called stating she has concerns about  losartan-hydrochlorothiazide (HYZAAR) 100-25 MG per tablet -said she think her readings are elevated  And would like a call back -please advise

## 2013-11-02 NOTE — Telephone Encounter (Signed)
1. These readings are not too bad. Continue hyzaar  2.  Increase lopressor to 50 mg (2 x 25 mg) in the AM, continue 25 mg PM  3. Report back in a week.

## 2013-11-02 NOTE — Telephone Encounter (Signed)
Pt called follow up on the request. Please advise.

## 2013-11-03 NOTE — Telephone Encounter (Signed)
I phoned patient and let her know to continue with Hyzaar and to increase Lopressor to 50 mg in the A.M. And 25 mg in the P.M. And report back in 1 week. She understands and had no questions or concerns.

## 2013-11-06 ENCOUNTER — Other Ambulatory Visit: Payer: Self-pay | Admitting: *Deleted

## 2013-11-06 MED ORDER — PANTOPRAZOLE SODIUM 40 MG PO TBEC: 40.0000 mg | DELAYED_RELEASE_TABLET | Freq: Every day | ORAL | Status: DC

## 2013-11-06 MED ORDER — METHIMAZOLE 10 MG PO TABS: 10.0000 mg | ORAL_TABLET | Freq: Every day | ORAL | Status: DC

## 2013-11-06 MED ORDER — LOSARTAN POTASSIUM-HCTZ 100-25 MG PO TABS: 1.0000 | ORAL_TABLET | Freq: Every day | ORAL | Status: DC

## 2013-11-06 MED ORDER — METOPROLOL TARTRATE 25 MG PO TABS: 25.0000 mg | ORAL_TABLET | Freq: Two times a day (BID) | ORAL | Status: DC

## 2013-11-06 MED ORDER — MELOXICAM 15 MG PO TABS: 15.0000 mg | ORAL_TABLET | Freq: Every day | ORAL | Status: DC

## 2013-11-06 MED ORDER — ESTRADIOL 2 MG PO TABS: 1.0000 mg | ORAL_TABLET | Freq: Every day | ORAL | Status: DC

## 2013-11-08 ENCOUNTER — Telehealth: Payer: Self-pay

## 2013-11-08 DIAGNOSIS — M7989 Other specified soft tissue disorders: Secondary | ICD-10-CM

## 2013-11-08 NOTE — Telephone Encounter (Signed)
The patient called and is hoping to get a referral to Dr.Smith.

## 2013-11-09 NOTE — Telephone Encounter (Signed)
Dr Katrinka BlazingSmith Done

## 2013-11-09 NOTE — Telephone Encounter (Signed)
Spoke with pt advised her referral has been placed, once insurance verified, appoint would be scheduled.

## 2013-11-09 NOTE — Telephone Encounter (Signed)
Requesting referral for a fatty tumor or cyst in between Right thumb and pointer finger. [employer] is requiring referral. See 10/11/13 OV with Dr. Debby BudNorins.

## 2013-11-10 ENCOUNTER — Telehealth: Payer: Self-pay | Admitting: *Deleted

## 2013-11-10 NOTE — Telephone Encounter (Signed)
Patient phoned inquiring about [facility].  [facility] to [facility], they were sent on 11/06/13.  Notified patient date refills sent.

## 2013-11-15 ENCOUNTER — Other Ambulatory Visit (INDEPENDENT_AMBULATORY_CARE_PROVIDER_SITE_OTHER): Payer: Medicare HMO

## 2013-11-15 ENCOUNTER — Ambulatory Visit (INDEPENDENT_AMBULATORY_CARE_PROVIDER_SITE_OTHER): Payer: Medicare HMO | Admitting: Family Medicine

## 2013-11-15 ENCOUNTER — Encounter: Payer: Self-pay | Admitting: Internal Medicine

## 2013-11-15 ENCOUNTER — Encounter: Payer: Self-pay | Admitting: Family Medicine

## 2013-11-15 ENCOUNTER — Ambulatory Visit (INDEPENDENT_AMBULATORY_CARE_PROVIDER_SITE_OTHER): Payer: Medicare HMO | Admitting: Internal Medicine

## 2013-11-15 VITALS — BP 140/86 | HR 73 | Temp 97.7°F | Resp 16 | Wt 190.1 lb

## 2013-11-15 VITALS — BP 140/86 | HR 73 | Temp 97.7°F | Resp 16 | Ht 68.0 in | Wt 190.8 lb

## 2013-11-15 DIAGNOSIS — M2550 Pain in unspecified joint: Secondary | ICD-10-CM

## 2013-11-15 DIAGNOSIS — M48061 Spinal stenosis, lumbar region without neurogenic claudication: Secondary | ICD-10-CM

## 2013-11-15 DIAGNOSIS — R2231 Localized swelling, mass and lump, right upper limb: Secondary | ICD-10-CM

## 2013-11-15 DIAGNOSIS — M7989 Other specified soft tissue disorders: Secondary | ICD-10-CM

## 2013-11-15 DIAGNOSIS — D649 Anemia, unspecified: Secondary | ICD-10-CM

## 2013-11-15 DIAGNOSIS — G894 Chronic pain syndrome: Secondary | ICD-10-CM

## 2013-11-15 DIAGNOSIS — I10 Essential (primary) hypertension: Secondary | ICD-10-CM

## 2013-11-15 DIAGNOSIS — R229 Localized swelling, mass and lump, unspecified: Secondary | ICD-10-CM

## 2013-11-15 MED ORDER — METOPROLOL TARTRATE 50 MG PO TABS: 50.0000 mg | ORAL_TABLET | Freq: Two times a day (BID) | ORAL | Status: DC

## 2013-11-15 MED ORDER — MORPHINE SULFATE ER 15 MG PO TBCR: 15.0000 mg | EXTENDED_RELEASE_TABLET | Freq: Two times a day (BID) | ORAL | Status: DC

## 2013-11-15 NOTE — Assessment & Plan Note (Signed)
Continue with current prescription therapy as reflected on the Med list.  

## 2013-11-15 NOTE — Progress Notes (Signed)
  I'm seeing this patient by the request  of:  Sonda PrimesAlex Plotnikov, MD   CC: Right thumb exam  HPI: Patient is a very pleasant 68 year old female who is coming in with a growth on her right thumb. Patient states that she has had this for approximately 1 year. Patient does not remember any true injury. Patient states that this seems to be enlarging over the course of time. Patient states there may be some fluctuation but she does not notice that much. Patient denies any numbness or weakness. Patient was sent here to for further evaluation.   Past medical, surgical, family and social history reviewed. Medications reviewed all in the electronic medical record.   Review of Systems: No headache, visual changes, nausea, vomiting, diarrhea, constipation, dizziness, abdominal pain, skin rash, fevers, chills, night sweats, weight loss, swollen lymph nodes, body aches, joint swelling, muscle aches, chest pain, shortness of breath, mood changes.   Objective:    Blood pressure 140/86, pulse 73, temperature 97.7 F (36.5 C), temperature source Oral, resp. rate 16, weight 190 lb 1.9 oz (86.238 kg), SpO2 96.00%.   General: No apparent distress alert and oriented x3 mood and affect normal, dressed appropriately.  HEENT: Pupils equal, extraocular movements intact Respiratory: Patient's speak in full sentences and does not appear short of breath Cardiovascular: No lower extremity edema, non tender, no erythema Skin: Warm dry intact with no signs of infection or rash on extremities or on axial skeleton. Abdomen: Soft nontender Neuro: Cranial nerves II through XII are intact, neurovascularly intact in all extremities with 2+ DTRs and 2+ pulses. Lymph: No lymphadenopathy of posterior or anterior cervical chain or axillae bilaterally.  Gait normal with good balance and coordination.  MSK: Non tender with full range of motion and good stability and symmetric strength and tone of shoulders, elbows, wrist, hip, knee and  ankles bilaterally.  And exam of the right hand shows the patient does have significant fullness of the right thenar eminence. There is a fluctuant mass that measures approximately 2 cm in diameter. This does feel encapsulated. Patient is nontender on exam. She is neurovascularly intact distally. Patient does have mild atrophy of the thenar musculature on the dorsal aspect bilaterally.  Limited musculoskeletal ultrasound was performed and interpreted by Antoine PrimasSMITH, Barrie Wale, M Ultrasound of the thenar eminence of the right hand shows that there is likely is a soft tissue growth. There is some mild calcifications in this area as well. This could be a chronic ganglion cyst but unlikely. No significant increased Doppler flow noted. No aneurysm noted.  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

## 2013-11-15 NOTE — Assessment & Plan Note (Addendum)
Patient is not having any pain from this and does have full strength as well as is neurovascularly intact distally. Differential includes soft tissue mass versus a chronic ganglion cyst. Under ultrasound I did not see any fluid pockets that would allow for aspiration.  Other differential includes epidermoid cyst, fibroma, or sarcoma. Discuss different treatment options with patient and we decided to referral to a hand specialist would be ideal.   Patient will be referred and can come back for any other questions or any other muscular skeletal problems. We will defer further imaging to the orthopedic specialist.

## 2013-11-15 NOTE — Patient Instructions (Signed)
Very nice to meet you Dr. Janee [facility]hompson [facility] or [facility]Supple [facility]  They will call you with an appointment.  I do think it looks like a soft tissue mass and may need further imaging.

## 2013-11-15 NOTE — Progress Notes (Signed)
Pre visit review using our clinic review tool, if applicable. No additional management support is needed unless otherwise documented below in the visit note. 

## 2013-11-15 NOTE — Assessment & Plan Note (Signed)
Lopressor - will increase to 50 mg bid Cont Hyzaar

## 2013-11-15 NOTE — Progress Notes (Signed)
Pre-visit discussion using our clinic review tool. No additional management support is needed unless otherwise documented below in the visit note.  

## 2013-11-15 NOTE — Progress Notes (Signed)
   Subjective:    Patient ID: [name] [name], female    DOB: Aug 25, 1946, 68 y.o.   MRN: [id number]  HPI  The patient presents for a follow-up of  chronic hypertension, chronic dyslipidemia, anemia, chronic pain controlled with medicines    Review of Systems  Constitutional: Negative for chills, activity change, appetite change, fatigue and unexpected weight change.  HENT: Negative for congestion, mouth sores and sinus pressure.   Eyes: Negative for visual disturbance.  Respiratory: Negative for cough and chest tightness.   Gastrointestinal: Negative for nausea and abdominal pain.  Genitourinary: Negative for frequency, difficulty urinating and vaginal pain.  Musculoskeletal: Negative for back pain and gait problem.  Skin: Negative for pallor and rash.  Neurological: Negative for dizziness, tremors, weakness, numbness and headaches.  Psychiatric/Behavioral: Negative for confusion and sleep disturbance.       Objective:   Physical Exam  Constitutional: She appears well-developed. No distress.  HENT:  Head: Normocephalic.  Right Ear: External ear normal.  Left Ear: External ear normal.  Nose: Nose normal.  Mouth/Throat: Oropharynx is clear and moist.  Eyes: Conjunctivae are normal. Pupils are equal, round, and reactive to light. Right eye exhibits no discharge. Left eye exhibits no discharge.  Neck: Normal range of motion. Neck supple. No JVD present. No tracheal deviation present. No thyromegaly present.  Cardiovascular: Normal rate, regular rhythm and normal heart sounds.   Pulmonary/Chest: No stridor. No respiratory distress. She has no wheezes.  Abdominal: Soft. Bowel sounds are normal. She exhibits no distension and no mass. There is no tenderness. There is no rebound and no guarding.  Musculoskeletal: She exhibits no edema and no tenderness.  Lymphadenopathy:    She has no cervical adenopathy.  Neurological: She displays normal reflexes. No cranial nerve deficit. She  exhibits normal muscle tone. Coordination normal.  Skin: No rash noted. No erythema.  Psychiatric: She has a normal mood and affect. Her behavior is normal. Judgment and thought content normal.    Lab Results  Component Value Date   WBC 6.2 04/14/2013   HGB 12.2 04/14/2013   HCT 35.7* 04/14/2013   PLT 215.0 04/14/2013   GLUCOSE 93 04/25/2013   CHOL 188 02/18/2012   TRIG 191.0* 02/18/2012   HDL 80.00 02/18/2012   LDLCALC 70 02/18/2012   ALT 10 04/25/2013   AST 15 04/25/2013   NA 137 04/25/2013   K 5.1 04/25/2013   CL 102 04/25/2013   CREATININE 1.1 04/25/2013   BUN 18 04/25/2013   CO2 31 04/25/2013   TSH 1.70 02/18/2012         Assessment & Plan:

## 2013-11-15 NOTE — Assessment & Plan Note (Signed)
Chronic   Potential benefits of a long term opioids use as well as potential risks (i.e. addiction risk, apnea etc) and complications (i.e. Somnolence, constipation and others) were explained to the patient and were aknowledged. 

## 2013-11-16 ENCOUNTER — Encounter: Payer: Self-pay | Admitting: Internal Medicine

## 2013-11-21 ENCOUNTER — Telehealth: Payer: Self-pay

## 2013-11-21 NOTE — Telephone Encounter (Signed)
The patient called the triage line and is hoping to get a surgery date for her orthopedic surgery.  She states she sees Dr.Smith, and was advised to seek a surgical option.    The patient's callback - [phone]

## 2013-11-22 NOTE — Telephone Encounter (Signed)
Pt has a remaining balance at [facility]. They will not see her until she pays balance of $200. She can not pay this at this time. She also can not go [facility]. [facility] for her right hand soft tissue mass/edema. Please advise.

## 2013-11-22 NOTE — Telephone Encounter (Signed)
Called patient back in discussed we would have to go out of network to [facility] for any other hand specialists. Patient is going to see if she can attempt to find the $200 that she can go to [facility] but is unable to she will call back and we'll send a referral to [facility]

## 2013-11-23 ENCOUNTER — Telehealth: Payer: Self-pay | Admitting: Internal Medicine

## 2013-11-23 NOTE — Telephone Encounter (Signed)
Pt called stated that she received a letter from [employer] stating that Estradiol can cause major problems to women over 70 years old. Pt stated that she does not have any side effect, but pt is very concern about taking this medication due to the letter. Please advise.

## 2013-11-23 NOTE — Telephone Encounter (Signed)
Yes, there are risks. Risks are much higher with a longer use of Estrogens, especially >15 years. Thx

## 2013-11-24 NOTE — Telephone Encounter (Signed)
Pt will call back to make follow up appt with Dr. Macario GoldsPlot.

## 2013-11-27 ENCOUNTER — Other Ambulatory Visit (INDEPENDENT_AMBULATORY_CARE_PROVIDER_SITE_OTHER): Payer: Medicare HMO

## 2013-11-27 ENCOUNTER — Ambulatory Visit (INDEPENDENT_AMBULATORY_CARE_PROVIDER_SITE_OTHER): Payer: Medicare HMO | Admitting: Internal Medicine

## 2013-11-27 ENCOUNTER — Encounter: Payer: Self-pay | Admitting: Internal Medicine

## 2013-11-27 VITALS — BP 140/82 | HR 76 | Temp 98.2°F | Resp 16 | Wt 191.0 lb

## 2013-11-27 DIAGNOSIS — M2550 Pain in unspecified joint: Secondary | ICD-10-CM

## 2013-11-27 DIAGNOSIS — I872 Venous insufficiency (chronic) (peripheral): Secondary | ICD-10-CM

## 2013-11-27 DIAGNOSIS — I10 Essential (primary) hypertension: Secondary | ICD-10-CM

## 2013-11-27 DIAGNOSIS — D649 Anemia, unspecified: Secondary | ICD-10-CM

## 2013-11-27 DIAGNOSIS — N951 Menopausal and female climacteric states: Secondary | ICD-10-CM | POA: Insufficient documentation

## 2013-11-27 LAB — CBC WITH DIFFERENTIAL/PLATELET
BASOS ABS: 0 10*3/uL (ref 0.0–0.1)
Basophils Relative: 0.3 % (ref 0.0–3.0)
EOS ABS: 0.2 10*3/uL (ref 0.0–0.7)
Eosinophils Relative: 2.6 % (ref 0.0–5.0)
HEMATOCRIT: 37.9 % (ref 36.0–46.0)
HEMOGLOBIN: 12.6 g/dL (ref 12.0–15.0)
LYMPHS ABS: 2.3 10*3/uL (ref 0.7–4.0)
LYMPHS PCT: 34.3 % (ref 12.0–46.0)
MCHC: 33.2 g/dL (ref 30.0–36.0)
MCV: 89.4 fl (ref 78.0–100.0)
MONO ABS: 0.5 10*3/uL (ref 0.1–1.0)
Monocytes Relative: 7.3 % (ref 3.0–12.0)
NEUTROS ABS: 3.7 10*3/uL (ref 1.4–7.7)
Neutrophils Relative %: 55.5 % (ref 43.0–77.0)
Platelets: 259 10*3/uL (ref 150.0–400.0)
RBC: 4.24 Mil/uL (ref 3.87–5.11)
RDW: 14.7 % — AB (ref 11.5–14.6)
WBC: 6.7 10*3/uL (ref 4.5–10.5)

## 2013-11-27 LAB — BASIC METABOLIC PANEL
BUN: 22 mg/dL (ref 6–23)
CHLORIDE: 103 meq/L (ref 96–112)
CO2: 30 meq/L (ref 19–32)
Calcium: 9.3 mg/dL (ref 8.4–10.5)
Creatinine, Ser: 1.1 mg/dL (ref 0.4–1.2)
GFR: 64.64 mL/min (ref >60.00)
GLUCOSE: 83 mg/dL (ref 70–99)
Potassium: 4.1 mEq/L (ref 3.5–5.1)
SODIUM: 141 meq/L (ref 135–145)

## 2013-11-27 LAB — TSH: TSH: 3.36 u[IU]/mL (ref 0.35–5.50)

## 2013-11-27 MED ORDER — VENLAFAXINE HCL ER 37.5 MG PO CP24: 37.5000 mg | ORAL_CAPSULE | Freq: Every day | ORAL | Status: DC

## 2013-11-27 MED ORDER — MORPHINE SULFATE ER 15 MG PO TBCR: 15.0000 mg | EXTENDED_RELEASE_TABLET | Freq: Two times a day (BID) | ORAL | Status: DC

## 2013-11-27 NOTE — Assessment & Plan Note (Signed)
Continue with current prescription therapy as reflected on the Med list.  

## 2013-11-27 NOTE — Progress Notes (Signed)
Pre visit review using our clinic review tool, if applicable. No additional management support is needed unless otherwise documented below in the visit note. 

## 2013-11-27 NOTE — Assessment & Plan Note (Signed)
Yes, there are risks. Risks are much higher with a longer use of Estrogens, especially >15 years.  We d/cd estrogens 2/15

## 2013-11-27 NOTE — Assessment & Plan Note (Signed)
We d/cd estrogens 2/15

## 2013-12-04 ENCOUNTER — Telehealth: Payer: Self-pay | Admitting: Family Medicine

## 2013-12-04 ENCOUNTER — Other Ambulatory Visit: Payer: Self-pay | Admitting: Family Medicine

## 2013-12-04 DIAGNOSIS — R2231 Localized swelling, mass and lump, right upper limb: Secondary | ICD-10-CM

## 2013-12-04 NOTE — Telephone Encounter (Signed)
Referral placed.

## 2013-12-04 NOTE — Telephone Encounter (Signed)
Pt was referred to someone in [place].  She can't go there.  She wants to be [facility] to [facility] for the mass in her finger.

## 2013-12-04 NOTE — Telephone Encounter (Signed)
[employer] offers help with transportation.  But it can't be over 25 mls.  [facility] [place] miles [place].  If Dr. Katrinka BlazingSmith says she needs to go there they may waive the 25 mile limit.

## 2013-12-22 ENCOUNTER — Other Ambulatory Visit (HOSPITAL_COMMUNITY): Payer: Self-pay | Admitting: *Deleted

## 2013-12-22 DIAGNOSIS — Z139 Encounter for screening, unspecified: Secondary | ICD-10-CM

## 2013-12-27 ENCOUNTER — Ambulatory Visit (HOSPITAL_COMMUNITY): Payer: Medicare HMO

## 2014-01-03 NOTE — Telephone Encounter (Signed)
Error

## 2014-01-12 ENCOUNTER — Telehealth: Payer: Self-pay

## 2014-01-12 DIAGNOSIS — M2550 Pain in unspecified joint: Secondary | ICD-10-CM

## 2014-01-12 MED ORDER — MORPHINE SULFATE ER 15 MG PO TBCR: 15.0000 mg | EXTENDED_RELEASE_TABLET | Freq: Two times a day (BID) | ORAL | Status: DC

## 2014-01-12 NOTE — Telephone Encounter (Signed)
Ok ROV q 3 mo Contract pls Thx!

## 2014-01-12 NOTE — Telephone Encounter (Signed)
RX printed x three months and contract printed. Pt notified to pick up Monday 01/15/14

## 2014-01-12 NOTE — Telephone Encounter (Signed)
Patient called [facility] requesting Rx refills x 3 months for Morphine, next scripts should start with April 21/15. Thanks

## 2014-01-30 ENCOUNTER — Telehealth: Payer: Self-pay | Admitting: Family Medicine

## 2014-01-30 NOTE — Telephone Encounter (Signed)
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  Just wanted to advice you.

## 2014-01-31 ENCOUNTER — Emergency Department (HOSPITAL_COMMUNITY)
Admission: EM | Admit: 2014-01-31 | Discharge: 2014-02-01 | Disposition: A | Discharge status: Home / Self Care | Payer: Medicare HMO

## 2014-01-31 NOTE — ED Notes (Signed)
No answer in [facility]

## 2014-02-01 NOTE — ED Notes (Signed)
Pt was called three times and no response

## 2014-02-02 ENCOUNTER — Other Ambulatory Visit (INDEPENDENT_AMBULATORY_CARE_PROVIDER_SITE_OTHER): Payer: Commercial Managed Care - HMO

## 2014-02-02 ENCOUNTER — Ambulatory Visit (INDEPENDENT_AMBULATORY_CARE_PROVIDER_SITE_OTHER): Payer: Commercial Managed Care - HMO | Admitting: Internal Medicine

## 2014-02-02 ENCOUNTER — Encounter: Payer: Self-pay | Admitting: Internal Medicine

## 2014-02-02 VITALS — BP 122/78 | HR 73 | Temp 98.2°F | Wt 190.0 lb

## 2014-02-02 DIAGNOSIS — R209 Unspecified disturbances of skin sensation: Secondary | ICD-10-CM

## 2014-02-02 DIAGNOSIS — R202 Paresthesia of skin: Secondary | ICD-10-CM

## 2014-02-02 DIAGNOSIS — R229 Localized swelling, mass and lump, unspecified: Secondary | ICD-10-CM

## 2014-02-02 DIAGNOSIS — E0590 Thyrotoxicosis, unspecified without thyrotoxic crisis or storm: Secondary | ICD-10-CM

## 2014-02-02 DIAGNOSIS — R2231 Localized swelling, mass and lump, right upper limb: Secondary | ICD-10-CM

## 2014-02-02 LAB — BASIC METABOLIC PANEL
BUN: 20 mg/dL (ref 6–23)
CHLORIDE: 102 meq/L (ref 96–112)
CO2: 30 mEq/L (ref 19–32)
Calcium: 9.9 mg/dL (ref 8.4–10.5)
Creatinine, Ser: 1.4 mg/dL — ABNORMAL HIGH (ref 0.4–1.2)
GFR: 46.73 mL/min — ABNORMAL LOW (ref >60.00)
Glucose, Bld: 89 mg/dL (ref 70–99)
Potassium: 4.8 mEq/L (ref 3.5–5.1)
Sodium: 139 mEq/L (ref 135–145)

## 2014-02-02 LAB — TSH: TSH: 1.17 u[IU]/mL (ref 0.35–5.50)

## 2014-02-02 LAB — SEDIMENTATION RATE: Sed Rate: 24 mm/hr — ABNORMAL HIGH (ref 0–22)

## 2014-02-02 LAB — VITAMIN B12: VITAMIN B 12: 731 pg/mL (ref 211–911)

## 2014-02-02 NOTE — Assessment & Plan Note (Signed)
Lipomas - Dr Luiz BlareGraves will operate

## 2014-02-02 NOTE — Progress Notes (Signed)
   Subjective:    HPI  C/o R hand and arm go numb and painful and locked when she woke up 2 nights ago - resolved after 1-2 hrs  The patient presents for a follow-up of  chronic hypertension, chronic dyslipidemia, anemia, chronic pain controlled with medicines    Review of Systems  Constitutional: Negative for chills, activity change, appetite change, fatigue and unexpected weight change.  HENT: Negative for congestion, mouth sores and sinus pressure.   Eyes: Negative for visual disturbance.  Respiratory: Negative for cough and chest tightness.   Gastrointestinal: Negative for nausea and abdominal pain.  Genitourinary: Negative for frequency, difficulty urinating and vaginal pain.  Musculoskeletal: Negative for back pain and gait problem.  Skin: Negative for pallor and rash.  Neurological: Negative for dizziness, tremors, weakness, numbness and headaches.  Psychiatric/Behavioral: Negative for confusion and sleep disturbance.       Objective:   Physical Exam  Constitutional: She appears well-developed. No distress.  HENT:  Head: Normocephalic.  Right Ear: External ear normal.  Left Ear: External ear normal.  Nose: Nose normal.  Mouth/Throat: Oropharynx is clear and moist.  Eyes: Conjunctivae are normal. Pupils are equal, round, and reactive to light. Right eye exhibits no discharge. Left eye exhibits no discharge.  Neck: Normal range of motion. Neck supple. No JVD present. No tracheal deviation present. No thyromegaly present.  Cardiovascular: Normal rate, regular rhythm and normal heart sounds.   Pulmonary/Chest: No stridor. No respiratory distress. She has no wheezes.  Abdominal: Soft. Bowel sounds are normal. She exhibits no distension and no mass. There is no tenderness. There is no rebound and no guarding.  Musculoskeletal: She exhibits no edema and no tenderness.  Lymphadenopathy:    She has no cervical adenopathy.  Neurological: She displays normal reflexes. No  cranial nerve deficit. She exhibits normal muscle tone. Coordination normal.  Skin: No rash noted. No erythema.  Psychiatric: She has a normal mood and affect. Her behavior is normal. Judgment and thought content normal.  No CTS signs  Lab Results  Component Value Date   WBC 6.7 11/27/2013   HGB 12.6 11/27/2013   HCT 37.9 11/27/2013   PLT 259.0 11/27/2013   GLUCOSE 83 11/27/2013   CHOL 188 02/18/2012   TRIG 191.0* 02/18/2012   HDL 80.00 02/18/2012   LDLCALC 70 02/18/2012   ALT 10 04/25/2013   AST 15 04/25/2013   NA 141 11/27/2013   K 4.1 11/27/2013   CL 103 11/27/2013   CREATININE 1.1 11/27/2013   BUN 22 11/27/2013   CO2 30 11/27/2013   TSH 3.36 11/27/2013         Assessment & Plan:

## 2014-02-02 NOTE — Assessment & Plan Note (Signed)
4/15 R hand/forearm - mechanical Labs

## 2014-02-02 NOTE — Progress Notes (Signed)
Pre visit review using our clinic review tool, if applicable. No additional management support is needed unless otherwise documented below in the visit note. 

## 2014-02-02 NOTE — Assessment & Plan Note (Signed)
TSH 

## 2014-02-19 ENCOUNTER — Other Ambulatory Visit (INDEPENDENT_AMBULATORY_CARE_PROVIDER_SITE_OTHER): Payer: Commercial Managed Care - HMO

## 2014-02-19 ENCOUNTER — Encounter: Payer: Self-pay | Admitting: Internal Medicine

## 2014-02-19 ENCOUNTER — Ambulatory Visit (INDEPENDENT_AMBULATORY_CARE_PROVIDER_SITE_OTHER): Payer: Commercial Managed Care - HMO | Admitting: Internal Medicine

## 2014-02-19 VITALS — BP 142/80 | HR 68 | Temp 96.9°F | Ht 68.0 in | Wt 186.0 lb

## 2014-02-19 DIAGNOSIS — M545 Low back pain, unspecified: Secondary | ICD-10-CM

## 2014-02-19 DIAGNOSIS — M2550 Pain in unspecified joint: Secondary | ICD-10-CM

## 2014-02-19 DIAGNOSIS — F329 Major depressive disorder, single episode, unspecified: Secondary | ICD-10-CM

## 2014-02-19 DIAGNOSIS — J309 Allergic rhinitis, unspecified: Secondary | ICD-10-CM

## 2014-02-19 DIAGNOSIS — D509 Iron deficiency anemia, unspecified: Secondary | ICD-10-CM

## 2014-02-19 DIAGNOSIS — I10 Essential (primary) hypertension: Secondary | ICD-10-CM

## 2014-02-19 DIAGNOSIS — Z0000 Encounter for general adult medical examination without abnormal findings: Secondary | ICD-10-CM

## 2014-02-19 DIAGNOSIS — F3289 Other specified depressive episodes: Secondary | ICD-10-CM

## 2014-02-19 LAB — BASIC METABOLIC PANEL WITH GFR
BUN: 16 mg/dL (ref 6–23)
CO2: 30 meq/L (ref 19–32)
Calcium: 10.1 mg/dL (ref 8.4–10.5)
Chloride: 102 meq/L (ref 96–112)
Creatinine, Ser: 1.1 mg/dL (ref 0.4–1.2)
GFR: 62.59 mL/min
Glucose, Bld: 95 mg/dL (ref 70–99)
Potassium: 3.7 meq/L (ref 3.5–5.1)
Sodium: 141 meq/L (ref 135–145)

## 2014-02-19 LAB — URINALYSIS, ROUTINE W REFLEX MICROSCOPIC
Bilirubin Urine: NEGATIVE
Hgb urine dipstick: NEGATIVE
Ketones, ur: NEGATIVE
NITRITE: NEGATIVE
RBC / HPF: NONE SEEN (ref 0–?)
Specific Gravity, Urine: 1.01 (ref 1.000–1.030)
Total Protein, Urine: NEGATIVE
UROBILINOGEN UA: 0.2 (ref 0.0–1.0)
Urine Glucose: NEGATIVE
pH: 7 (ref 5.0–8.0)

## 2014-02-19 LAB — IBC PANEL
IRON: 68 ug/dL (ref 42–145)
Saturation Ratios: 14.8 % — ABNORMAL LOW (ref 20.0–50.0)
TRANSFERRIN: 328.9 mg/dL (ref 212.0–360.0)

## 2014-02-19 LAB — HEPATIC FUNCTION PANEL
ALK PHOS: 79 U/L (ref 39–117)
ALT: 19 U/L (ref 0–35)
AST: 24 U/L (ref 0–37)
Albumin: 4.2 g/dL (ref 3.5–5.2)
BILIRUBIN TOTAL: 0.7 mg/dL (ref 0.3–1.2)
Bilirubin, Direct: 0.1 mg/dL (ref 0.0–0.3)
Total Protein: 7.4 g/dL (ref 6.0–8.3)

## 2014-02-19 LAB — CBC WITH DIFFERENTIAL/PLATELET
BASOS ABS: 0 10*3/uL (ref 0.0–0.1)
Basophils Relative: 0.6 % (ref 0.0–3.0)
Eosinophils Absolute: 0.2 10*3/uL (ref 0.0–0.7)
Eosinophils Relative: 3.6 % (ref 0.0–5.0)
HCT: 37 % (ref 36.0–46.0)
HEMOGLOBIN: 12.5 g/dL (ref 12.0–15.0)
LYMPHS PCT: 39.6 % (ref 12.0–46.0)
Lymphs Abs: 2.4 10*3/uL (ref 0.7–4.0)
MCHC: 33.8 g/dL (ref 30.0–36.0)
MCV: 89.1 fl (ref 78.0–100.0)
MONOS PCT: 7.7 % (ref 3.0–12.0)
Monocytes Absolute: 0.5 10*3/uL (ref 0.1–1.0)
NEUTROS PCT: 48.5 % (ref 43.0–77.0)
Neutro Abs: 2.9 10*3/uL (ref 1.4–7.7)
PLATELETS: 245 10*3/uL (ref 150.0–400.0)
RBC: 4.16 Mil/uL (ref 3.87–5.11)
RDW: 14.9 % — AB (ref 11.5–14.6)
WBC: 6 10*3/uL (ref 4.5–10.5)

## 2014-02-19 LAB — LIPID PANEL
Cholesterol: 158 mg/dL (ref 0–200)
HDL: 67.7 mg/dL
LDL Cholesterol: 69 mg/dL (ref 0–99)
Total CHOL/HDL Ratio: 2
Triglycerides: 108 mg/dL (ref 0.0–149.0)
VLDL: 21.6 mg/dL (ref 0.0–40.0)

## 2014-02-19 LAB — RHEUMATOID FACTOR: Rhuematoid fact SerPl-aCnc: <10 IU/mL (ref <=14)

## 2014-02-19 NOTE — Assessment & Plan Note (Signed)
CBC

## 2014-02-19 NOTE — Assessment & Plan Note (Signed)
Doing well 

## 2014-02-19 NOTE — Assessment & Plan Note (Signed)
Continue with current prescription therapy as reflected on the Med list.  

## 2014-02-19 NOTE — Assessment & Plan Note (Signed)

## 2014-02-19 NOTE — Progress Notes (Signed)
   Subjective:     HPI  The patient is here for a wellness exam. The patient has been doing well overall without major new physical or psychological issues going on lately. R hand surgery is planned on Wed.  The patient presents for a follow-up of  chronic hypertension, chronic dyslipidemia, anemia, chronic pain controlled with medicines    Review of Systems  Constitutional: Negative for chills, activity change, appetite change, fatigue and unexpected weight change.  HENT: Negative for congestion, mouth sores and sinus pressure.   Eyes: Negative for visual disturbance.  Respiratory: Negative for cough and chest tightness.   Gastrointestinal: Negative for nausea and abdominal pain.  Genitourinary: Negative for frequency, difficulty urinating and vaginal pain.  Musculoskeletal: Negative for back pain and gait problem.  Skin: Negative for pallor and rash.  Neurological: Negative for dizziness, tremors, weakness, numbness and headaches.  Psychiatric/Behavioral: Negative for confusion and sleep disturbance.       Objective:   Physical Exam  Constitutional: She appears well-developed. No distress.  HENT:  Head: Normocephalic.  Right Ear: External ear normal.  Left Ear: External ear normal.  Nose: Nose normal.  Mouth/Throat: Oropharynx is clear and moist.  Eyes: Conjunctivae are normal. Pupils are equal, round, and reactive to light. Right eye exhibits no discharge. Left eye exhibits no discharge.  Neck: Normal range of motion. Neck supple. No JVD present. No tracheal deviation present. No thyromegaly present.  Cardiovascular: Normal rate, regular rhythm and normal heart sounds.   Pulmonary/Chest: No stridor. No respiratory distress. She has no wheezes.  Abdominal: Soft. Bowel sounds are normal. She exhibits no distension and no mass. There is no tenderness. There is no rebound and no guarding.  Musculoskeletal: She exhibits no edema and no tenderness.  Lymphadenopathy:    She has  no cervical adenopathy.  Neurological: She displays normal reflexes. No cranial nerve deficit. She exhibits normal muscle tone. Coordination normal.  Skin: No rash noted. No erythema.  Psychiatric: She has a normal mood and affect. Her behavior is normal. Judgment and thought content normal.    Lab Results  Component Value Date   WBC 6.7 11/27/2013   HGB 12.6 11/27/2013   HCT 37.9 11/27/2013   PLT 259.0 11/27/2013   GLUCOSE 89 02/02/2014   CHOL 188 02/18/2012   TRIG 191.0* 02/18/2012   HDL 80.00 02/18/2012   LDLCALC 70 02/18/2012   ALT 10 04/25/2013   AST 15 04/25/2013   NA 139 02/02/2014   K 4.8 02/02/2014   CL 102 02/02/2014   CREATININE 1.4* 02/02/2014   BUN 20 02/02/2014   CO2 30 02/02/2014   TSH 1.17 02/02/2014         Assessment & Plan:

## 2014-02-19 NOTE — Progress Notes (Signed)
Pre visit review using our clinic review tool, if applicable. No additional management support is needed unless otherwise documented below in the visit note. 

## 2014-02-20 ENCOUNTER — Telehealth: Payer: Self-pay | Admitting: Internal Medicine

## 2014-02-20 NOTE — Telephone Encounter (Signed)
Relevant patient education assigned to patient using Emmi. ° °

## 2014-03-01 ENCOUNTER — Telehealth: Payer: Self-pay | Admitting: Internal Medicine

## 2014-03-01 NOTE — Telephone Encounter (Signed)
Pt informed of lab results per pt triage call request

## 2014-03-08 ENCOUNTER — Encounter: Payer: Self-pay | Admitting: Internal Medicine

## 2014-03-08 ENCOUNTER — Other Ambulatory Visit: Payer: Self-pay | Admitting: Obstetrics and Gynecology

## 2014-03-23 ENCOUNTER — Other Ambulatory Visit: Payer: Self-pay | Admitting: Internal Medicine

## 2014-04-03 ENCOUNTER — Other Ambulatory Visit: Payer: Self-pay | Admitting: Internal Medicine

## 2014-04-03 DIAGNOSIS — M2550 Pain in unspecified joint: Secondary | ICD-10-CM

## 2014-04-03 NOTE — Telephone Encounter (Signed)
Patient called requesting Rx refills x 3 months for Morphine. Please call when ready for pick-up.

## 2014-04-04 NOTE — Telephone Encounter (Signed)
OK to fill this prescription with additional refills x0 OV q 3 mo Thank you!  

## 2014-04-05 NOTE — Telephone Encounter (Signed)
Left mess for patient to call back.  

## 2014-04-12 MED ORDER — MORPHINE SULFATE ER 15 MG PO TBCR: 15.0000 mg | EXTENDED_RELEASE_TABLET | Freq: Two times a day (BID) | ORAL | Status: DC

## 2014-04-12 NOTE — Telephone Encounter (Signed)
Rx is upfront for p/u. Pt informed  

## 2014-05-06 ENCOUNTER — Telehealth: Payer: Self-pay | Admitting: Internal Medicine

## 2014-05-06 NOTE — Telephone Encounter (Signed)
OV w/me Monday 3:30 Thx

## 2014-05-06 NOTE — Telephone Encounter (Signed)
Caller: [name] [name]/Patient; PCP: Plotnikov, Alex (Adults only); CB#: [phone]; Call regarding Vomiting/diarrhea;  Onset: 05/04/14,  Afebrile.  Seen in ED 05/04/14. Diagnosed with low potassium and sent home.  On 05/06/14, pt called [employer] nurse line who advised  may be having narcotic withdrawl and [facility] [place].  Treated with Zofran odt in ED and given Rx for Phenergan that she has not picked up.  Felt too weak going home to stop at pharmacy. Concerned is having withdrawl since stopped Morphine 15 mg used BID on 05/03/14 because she left vial at home; only brought enough for 7 days and her trip was extended. Morphine used for severe back pain.  Aware she cannot get new 3 month Rx until seen in office.  Lost 5 lbs since 05/04/14.  Has not drank fluids or food all morning.  Last emesis and stool at 1300. Last void at 1300; did not see color.  Does not have BP monitor with her; BP 143/83 in ED.  Call provider now per nursing judgement for continuous or repeated vomiting for more than 8 hours and unable to keep any fluids down per Withdrawl Symptoms.  Dr Para Marchuncan ordered must go back to ED for possible narcotic withdrawl and weakness.  ED MD might give narcotic dose for today and tomorrow morning. Also ordered triage note be entered into EMR and sent to Dr Posey ReaPlotnikov for review 05/07/14.

## 2014-05-07 ENCOUNTER — Telehealth: Payer: Self-pay | Admitting: *Deleted

## 2014-05-07 NOTE — Telephone Encounter (Signed)
Yes, pls see a dr - try an Urgent Care Clinic Thx

## 2014-05-07 NOTE — Telephone Encounter (Signed)
[facility]-A-Nurse Triage Call Report Triage Record Num: [id number] Operator: Geanie BerlinEllen Hocevar Patient Name: [name] [name] Folse Call Date & Time: 05/06/2014 3:02:27PM Patient Phone: [phone] PCP: Sonda PrimesAlex Plotnikov Patient Gender: Female PCP Fax : [phone] Patient DOB: 11-07-1945 [facility] Name: [facility] Reason for Call: Caller: Netty/Patient; PCP: Sonda PrimesPlotnikov, Alex (Adults only); CB#: [phone]; Call regarding Vomiting/diarrhea; Onset: 05/04/14, Afebrile. Seen in ED 05/04/14. Diagnosed with low potassium and sent home. COn 05/06/14, called [employer] nurse line who advised may be having narcotic withdrawl and [facility] [place]. Treated with Zofran odt in ED and given Rx for Phenergan that she has not picked up. Concerned is having withdrawl since stopped Morphine 15 mg used BID on 05/03/14 because she left vial at home; only brought enough for 7 days and trip was extended. Morphine used for severe back pain. Aware she cannot get new 3 month Rx until seen in office. Lost 5 lbs since 05/04/14. Has not drank fluids or food all morning. Last emesis and stool at 1300. Last void at 1300; did not see color. Does not have BP monitor with her; BP 143/83 in ED. Call provider now per nursing judgement for continuous or repeated vomiting for more than 8 hours and unable to keep any fluids down per Withdrawl Symptoms. Dr Para Marchuncan ordered must go back to ED for possible narcotic withdrawl and weakness. ED MD might give narcotic dose for today and tomorrow morning. Also ordered triage note be entered into EMR and sent to Dr Posey ReaPlotnikov for review 05/07/14. Protocol(s) Used: Withdrawal Symptoms Recommended Outcome per Protocol: See Provider within 4 hours Reason for Outcome: Continuous or repeated vomiting for more than 8 hours AND unable to keep any fluids down Care Advice: ~ Another adult should drive. ~ Should not be alone, arrange for support (family member, friend, etc.). ~ Call provider if  symptoms worsen or new symptoms develop. ~ Call EMS 911 if major symptoms of withdrawal (such as seizures, hallucinations, or severe agitation). ~ SYMPTOM / CONDITION MANAGEMENT ~ CAUTIONS Nausea Care Advice: - Drink small amounts of clear, sweetened liquids or ice cold drinks. - Eat light, bland foods such as saltine crackers or plain bread. - Do not eat high fat, highly seasoned, high fiber, or high sugar content foods. - Avoid mixing hot food and cold foods. - Eat smaller, more frequent meals. - Rest as much as possible in a sitting or in a propped lying position. Do not lie flat for at least 2 hours after eating. - Do not take pain medication (such as aspirin, NSAIDs) while nauseated. - Rest as much as possible until symptoms improve since activity may worsen nausea. ~ ~ Avoid or limit use of caffeine, alcohol and non-prescription drugs. Vomiting Care Advice: - Do not eat solid foods until vomiting subsides. - Begin taking fluids by sucking on ice chips or popsicles or taking sips of cool clear, nonprescription oral rehydration ~ 05/06/2014 3:42:43PM Page 1 of 2 [id number] Call-A-Nurse Triage Call Report Patient Name: [name] [name] Keep[facility] continuation page/s solution). - Gradually drink larger amounts of these fluids so that you are drinking six to eight 8 oz. (.2 liter) of fluids a day. - Keep activity to a minimum. - After vomiting subsides, eat smaller, more frequent meals of easily digested foods such as crackers, toast, bananas, rice, cooked cereal, applesauce, broth, baked or mashed potatoes, chicken or [place]key without skin. Eat slowly. - Take fluids 30 minutes before or 60 minutes after meals. - Avoid high fat, highly  seasoned, high fiber or high sugar content foods. - Avoid extremely hot or cold foods. - Do not take pain medication (such as aspirin, NSAIDs) while nauseated or vomiting. - Consult your provider for advice regarding continuing prescription medication. - Rest  as much as possible in a sitting or in a propped lying position. Do not lie flat for at least 2 hours after eating. 05/06/2014 3:42:43PM Page 2 of 2 [email or web address]

## 2014-05-07 NOTE — Telephone Encounter (Signed)
Pt called back stating that she is still experiencing the vomiting and diarrhea, and states [place] 2 [place]/2 hour drive away.  Pt wants to know if she should see a doctor up that way.  ER has seen the pt twice and told her that she is having withdrawal to morphine.  Pt doesn't seem to think that this is the problem. Please advise

## 2014-05-07 NOTE — Telephone Encounter (Signed)
Pt.notified

## 2014-05-11 ENCOUNTER — Ambulatory Visit (INDEPENDENT_AMBULATORY_CARE_PROVIDER_SITE_OTHER): Payer: Commercial Managed Care - HMO | Admitting: Internal Medicine

## 2014-05-11 ENCOUNTER — Encounter: Payer: Self-pay | Admitting: Internal Medicine

## 2014-05-11 VITALS — BP 90/60 | HR 80 | Temp 98.3°F | Resp 16 | Wt 174.0 lb

## 2014-05-11 DIAGNOSIS — Z0000 Encounter for general adult medical examination without abnormal findings: Secondary | ICD-10-CM

## 2014-05-11 DIAGNOSIS — M545 Low back pain, unspecified: Secondary | ICD-10-CM

## 2014-05-11 DIAGNOSIS — K529 Noninfective gastroenteritis and colitis, unspecified: Secondary | ICD-10-CM

## 2014-05-11 DIAGNOSIS — I10 Essential (primary) hypertension: Secondary | ICD-10-CM

## 2014-05-11 DIAGNOSIS — R2231 Localized swelling, mass and lump, right upper limb: Secondary | ICD-10-CM

## 2014-05-11 DIAGNOSIS — M2550 Pain in unspecified joint: Secondary | ICD-10-CM

## 2014-05-11 DIAGNOSIS — G894 Chronic pain syndrome: Secondary | ICD-10-CM

## 2014-05-11 DIAGNOSIS — K5289 Other specified noninfective gastroenteritis and colitis: Secondary | ICD-10-CM

## 2014-05-11 DIAGNOSIS — F329 Major depressive disorder, single episode, unspecified: Secondary | ICD-10-CM

## 2014-05-11 DIAGNOSIS — F3289 Other specified depressive episodes: Secondary | ICD-10-CM

## 2014-05-11 DIAGNOSIS — R229 Localized swelling, mass and lump, unspecified: Secondary | ICD-10-CM

## 2014-05-11 MED ORDER — MORPHINE SULFATE ER 15 MG PO TBCR: 15.0000 mg | EXTENDED_RELEASE_TABLET | Freq: Two times a day (BID) | ORAL | Status: DC

## 2014-05-11 MED ORDER — MELOXICAM 15 MG PO TABS: 15.0000 mg | ORAL_TABLET | Freq: Every day | ORAL | Status: DC | PRN

## 2014-05-11 NOTE — Assessment & Plan Note (Signed)
Chronic.  Discussed. 

## 2014-05-11 NOTE — Assessment & Plan Note (Signed)
7/15 acute, aggravated bu MS withdrawal - much better Continue with current prescription therapy as reflected on the Med list. Boost

## 2014-05-11 NOTE — Progress Notes (Signed)
Subjective:    Diarrhea  This is a new problem. The current episode started in the past 7 days (last Fri). The problem occurs more than 10 times per day. The problem has been rapidly improving. The stool consistency is described as watery. The patient states that diarrhea awakens her from sleep. Associated symptoms include chills, sweats, vomiting and weight loss. Pertinent negatives include no abdominal pain, coughing or headaches. Treatments tried: phenergan. The treatment provided significant relief.   Vomiting/diarrhea; Onset: 05/04/14, Afebrile. Seen in ED 05/04/14. Diagnosed with  low potassium and sent home. COn 05/06/14, called [employer] nurse line who advised may be  having narcotic withdrawl and sent back to [facility]. Treated with Zofran  odt in ED and given Rx for Phenergan that she has not picked up. Concerned is having  withdrawl since stopped Morphine 15 mg used BID on 05/03/14 because she left vial at home;  only brought enough for 7 days and trip was extended. Morphine used for severe back pain.  Aware she cannot get new 3 month Rx until seen in office. Lost 5 lbs since 05/04/14. Has not  drank fluids or food all morning. Last emesis and stool at 1300. Last void at 1300; did not  see color. Does not have BP monitor with her; BP 143/83 in ED. Call provider now per  nursing judgement for continuous or repeated vomiting for more than 8 hours and unable to  keep any fluids down per Withdrawl Symptoms. Dr Para March ordered must go back to ED for  possible narcotic withdrawl and weakness. ED MD might give narcotic dose for today and  tomorrow morning. Also ordered triage note be entered into EMR and sent to Dr Posey Rea  for review 05/07/14.  The patient presents for a follow-up of  chronic hypertension, chronic dyslipidemia, anemia, chronic pain controlled with medicines    Review of Systems  Constitutional: Positive for chills and weight loss. Negative for activity change,  appetite change, fatigue and unexpected weight change.  HENT: Negative for congestion, mouth sores and sinus pressure.   Eyes: Negative for visual disturbance.  Respiratory: Negative for cough and chest tightness.   Gastrointestinal: Positive for vomiting and diarrhea. Negative for nausea and abdominal pain.  Genitourinary: Negative for frequency, difficulty urinating and vaginal pain.  Musculoskeletal: Negative for back pain and gait problem.  Skin: Negative for pallor and rash.  Neurological: Negative for dizziness, tremors, weakness, numbness and headaches.  Psychiatric/Behavioral: Negative for confusion and sleep disturbance.       Objective:   Physical Exam  Constitutional: She appears well-developed. No distress.  HENT:  Head: Normocephalic.  Right Ear: External ear normal.  Left Ear: External ear normal.  Nose: Nose normal.  Mouth/Throat: Oropharynx is clear and moist.  Eyes: Conjunctivae are normal. Pupils are equal, round, and reactive to light. Right eye exhibits no discharge. Left eye exhibits no discharge.  Neck: Normal range of motion. Neck supple. No JVD present. No tracheal deviation present. No thyromegaly present.  Cardiovascular: Normal rate, regular rhythm and normal heart sounds.   Pulmonary/Chest: No stridor. No respiratory distress. She has no wheezes.  Abdominal: Soft. Bowel sounds are normal. She exhibits no distension and no mass. There is no tenderness. There is no rebound and no guarding.  Musculoskeletal: She exhibits no edema and no tenderness.  Lymphadenopathy:    She has no cervical adenopathy.  Neurological: She displays normal reflexes. No cranial nerve deficit. She exhibits normal muscle tone. Coordination normal.  Skin: No rash noted. No erythema.  Psychiatric: She has a normal mood and affect. Her behavior is normal. Judgment and thought content normal.  Looks tired  Lab Results  Component Value Date   WBC 6.0 02/19/2014   HGB 12.5 02/19/2014    HCT 37.0 02/19/2014   PLT 245.0 02/19/2014   GLUCOSE 95 02/19/2014   CHOL 158 02/19/2014   TRIG 108.0 02/19/2014   HDL 67.70 02/19/2014   LDLCALC 69 02/19/2014   ALT 19 02/19/2014   AST 24 02/19/2014   NA 141 02/19/2014   K 3.7 02/19/2014   CL 102 02/19/2014   CREATININE 1.1 02/19/2014   BUN 16 02/19/2014   CO2 30 02/19/2014   TSH 1.17 02/02/2014    A complex case     Assessment & Plan:

## 2014-05-11 NOTE — Assessment & Plan Note (Signed)
7/15 acute, aggravated bu MS withdrawal - much better

## 2014-05-11 NOTE — Assessment & Plan Note (Signed)
7/15 had a withdrawal Continue with current prescription therapy as reflected on the Med list.  Potential benefits of a long term opioids use as well as potential risks (i.e. addiction risk, apnea etc) and complications (i.e. Somnolence, constipation and others) were explained to the patient and were aknowledged.

## 2014-05-11 NOTE — Patient Instructions (Signed)
Do not take Meloxicam x 2-3 weeks

## 2014-05-11 NOTE — Assessment & Plan Note (Signed)
Low BP: take BP meds 1/2 dose x 1 week

## 2014-05-11 NOTE — Progress Notes (Signed)
Pre visit review using our clinic review tool, if applicable. No additional management support is needed unless otherwise documented below in the visit note. 

## 2014-05-11 NOTE — Assessment & Plan Note (Signed)
Lipomas - Dr Luiz BlareGraves 2015 - removed

## 2014-05-11 NOTE — Assessment & Plan Note (Signed)
Continue with current prescription therapy as reflected on the Med list.  

## 2014-06-23 IMAGING — CT CT NECK W/ CM
4 of 5 series · 16 of 33 positions shown, 18 images · IV contrast (omnipaque)
Comparison: The face CT deep 08/15/2008.

CLINICAL DATA: Right submandibular nodule.

CT NECK WITH CONTRAST
TECHNIQUE: Multidetector CT imaging of the neck was performed with
intravenous contrast.
Contrast: 75mL OMNIPAQUE IOHEXOL 300 MG/ML  SOLN

[Series 3: neck_routine 3.0 b40s st · axial · 0.40mm/px · z∈[-173,-71]mm · 3 of 70 slices shown, 4 images]
[im 18/70  soft-tissue]
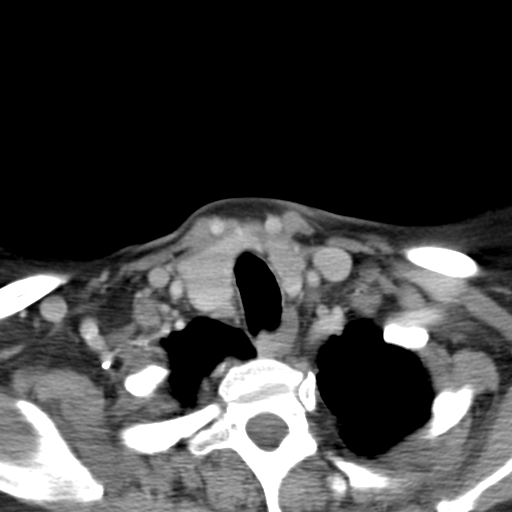
[im 18/70  bone]
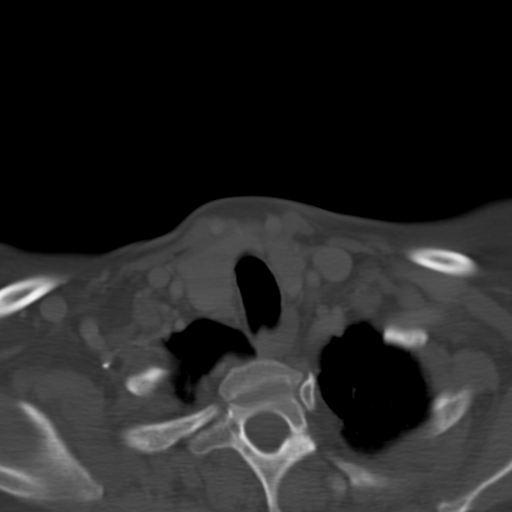
[im 35/70  bone]
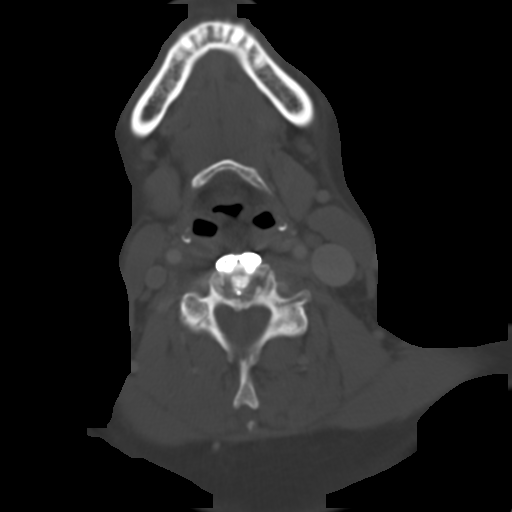
[im 52/70  bone]
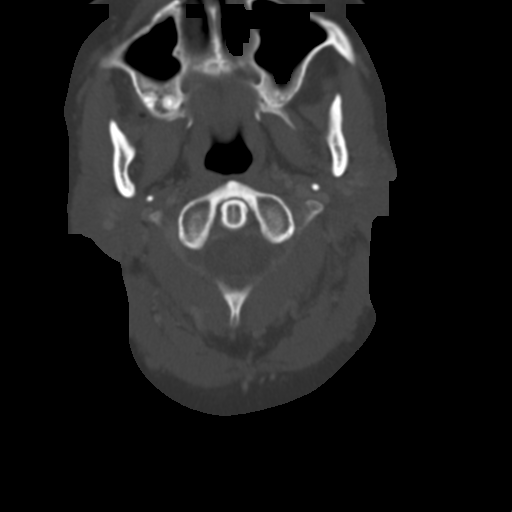

[Series 602: cor · coronal · 0.41mm/px · 3 of 85 slices shown]
[im 17/85  bone]
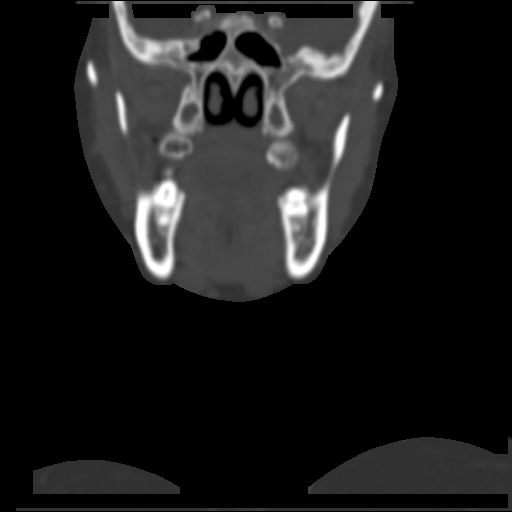
[im 34/85  bone]
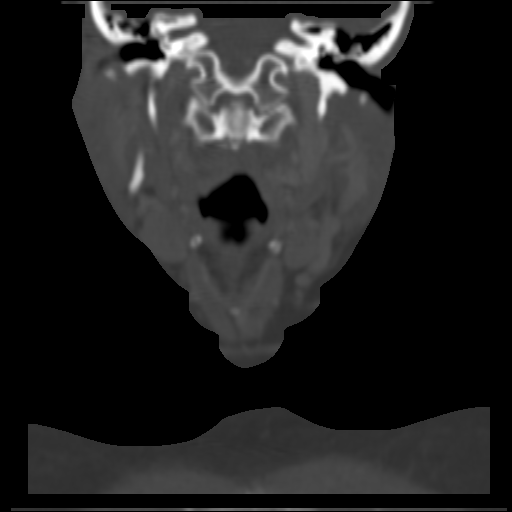
[im 51/85  bone]
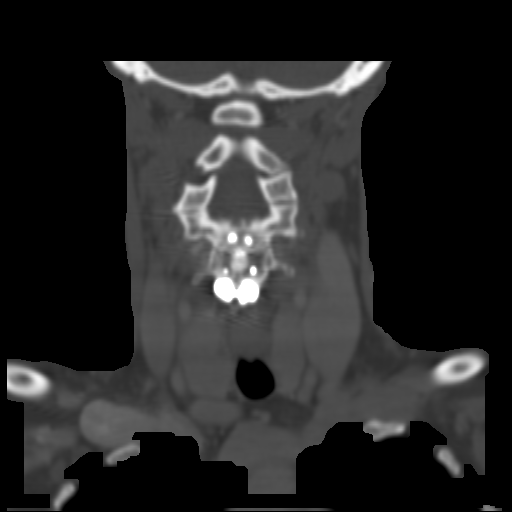

[Series 603: sag · sagittal · 0.41mm/px · 5 of 68 slices shown, 6 images]
[im 23/68  bone]
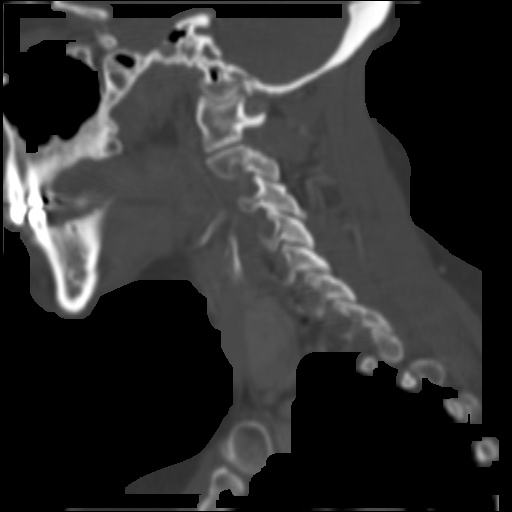
[im 28/68  bone]
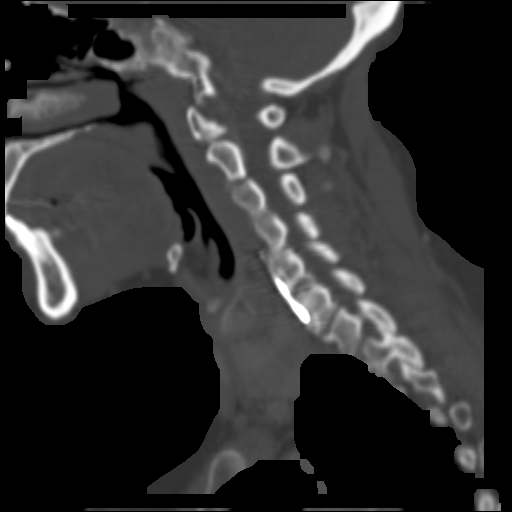
[im 34/68  soft-tissue]
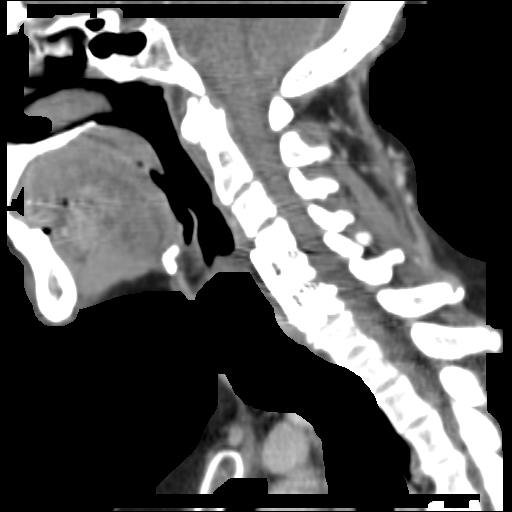
[im 34/68  bone]
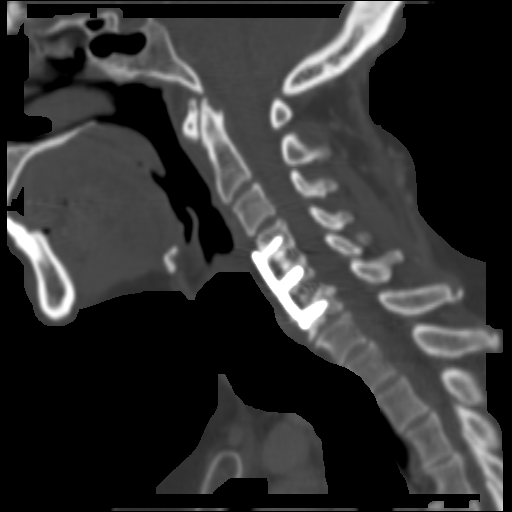
[im 40/68  bone]
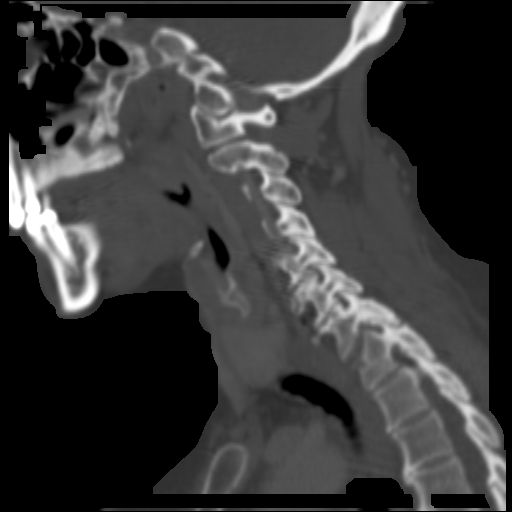
[im 45/68  bone]
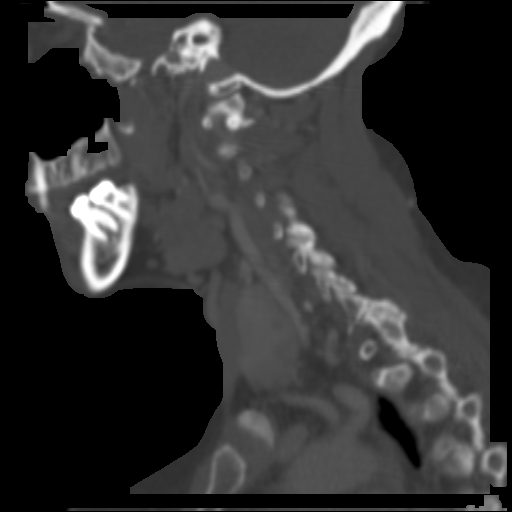

[Series 604: axial · axial · 0.30mm/px · z∈[-219,-101]mm · 5 of 101 slices shown]
[im 17/101  bone]
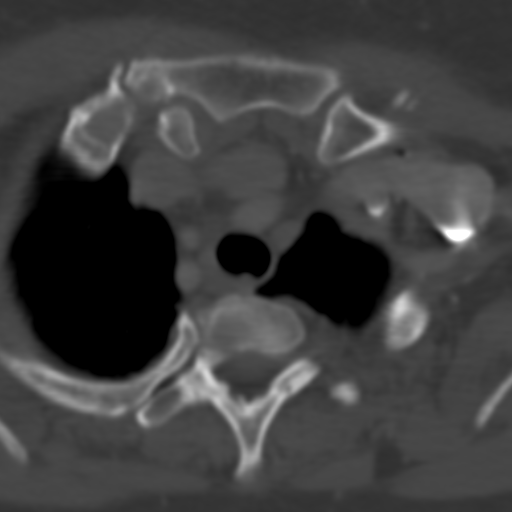
[im 34/101  bone]
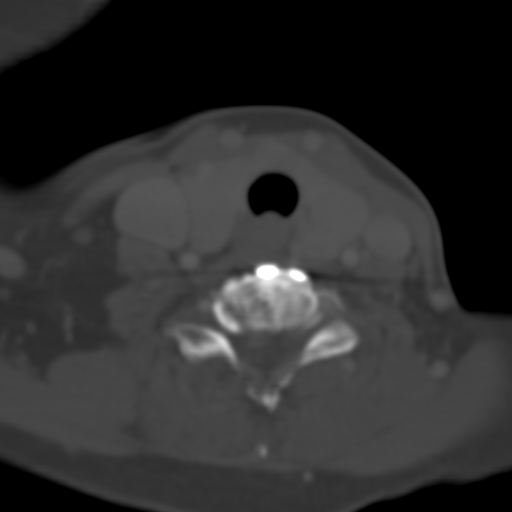
[im 51/101  bone]
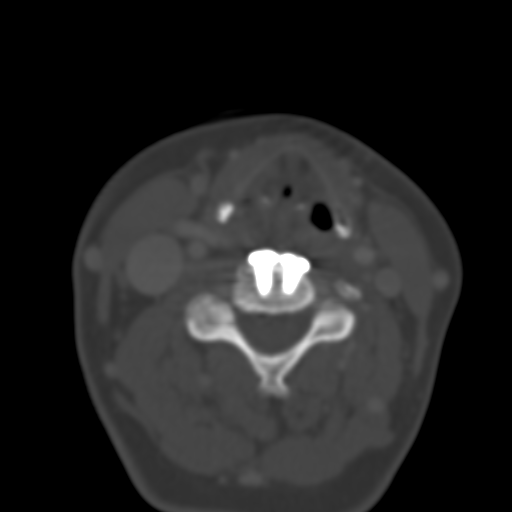
[im 67/101  bone]
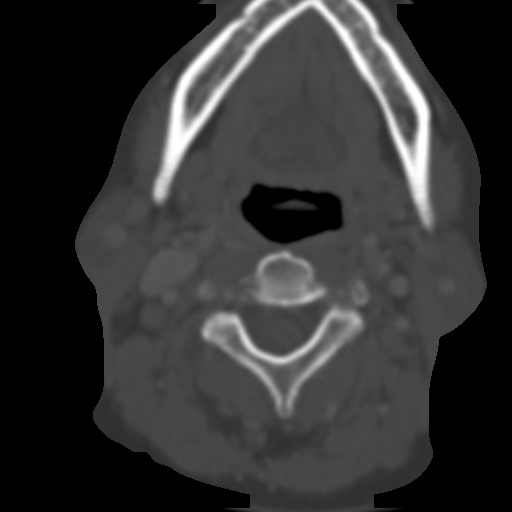
[im 84/101  bone]
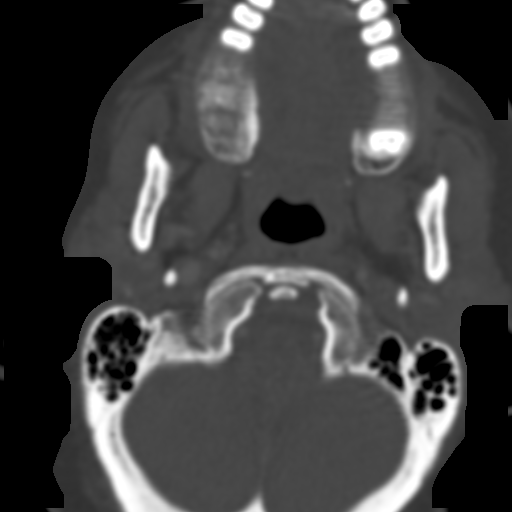

[16 of 33 positions shown; findings below may reference images not displayed]

FINDINGS: Limited imaging of the brain is unremarkable.

No focal mucosal or submucosal lesion is present.  Vocal cords
midline and symmetric.  There is slight prominence of the thyroid.
An 8.5 mm hypodense lesion is present posteriorly along the
inferior aspect of the right lobe of the thyroid.

The area marked is along the posterior margin of the right
submandibular gland.  The gland is symmetric in size and shape to
the left submandibular gland.  There is no mass lesion.  No
significant adenopathy or other soft tissue nodules present.

No significant cervical adenopathy is present.

The patient is status post anterior cervical discectomy and fusion
at C4-5 and C5-6.  The there is bridging bone at C4-5.  No definite
bridging bone is present at C5-6.  The bone windows are otherwise
unremarkable.
IMPRESSION: 1.  No focal lesion to explain the physical exam findings.  The
area marked is immediately subjacent to the right submandibular
gland which appears symmetric.
2.  No mass lesion or significant adenopathy.
3.  Postoperative changes at C4-5 and C5-6.
4.  8.5 mm lesion inferiorly in the right lobe of the thyroid. Sub-
centimeter thyroid nodule(s) noted, too small to characterize, but
most likely benign in the absence of known clinical risk factors
for thyroid carcinoma.

## 2014-07-27 ENCOUNTER — Telehealth: Payer: Self-pay | Admitting: *Deleted

## 2014-07-27 ENCOUNTER — Ambulatory Visit: Payer: Commercial Managed Care - HMO

## 2014-07-27 NOTE — Telephone Encounter (Signed)
Pt never came to pick up her MS contin script dated 04/12/14. Voiding script...[name] [name]/lmb

## 2014-08-09 ENCOUNTER — Ambulatory Visit (INDEPENDENT_AMBULATORY_CARE_PROVIDER_SITE_OTHER): Payer: Commercial Managed Care - HMO

## 2014-08-09 DIAGNOSIS — Z23 Encounter for immunization: Secondary | ICD-10-CM

## 2014-08-22 ENCOUNTER — Telehealth: Payer: Self-pay | Admitting: Internal Medicine

## 2014-08-22 DIAGNOSIS — M2550 Pain in unspecified joint: Secondary | ICD-10-CM

## 2014-08-22 MED ORDER — MORPHINE SULFATE ER 15 MG PO TBCR: 15.0000 mg | EXTENDED_RELEASE_TABLET | Freq: Two times a day (BID) | ORAL | Status: DC

## 2014-08-22 NOTE — Telephone Encounter (Signed)
Ok #60 OV q 3 mo Thx

## 2014-08-22 NOTE — Telephone Encounter (Signed)
Patient has an appt for 11/9 med fu.  She will be out of morphine before then.  She is requesting small script to get through until then.

## 2014-08-22 NOTE — Telephone Encounter (Signed)
Notified pt rx ready for pick-up.../lmb 

## 2014-09-03 ENCOUNTER — Ambulatory Visit (INDEPENDENT_AMBULATORY_CARE_PROVIDER_SITE_OTHER): Payer: Commercial Managed Care - HMO | Admitting: Internal Medicine

## 2014-09-03 ENCOUNTER — Encounter: Payer: Self-pay | Admitting: Internal Medicine

## 2014-09-03 VITALS — BP 130/78 | HR 69 | Temp 98.1°F | Wt 182.0 lb

## 2014-09-03 DIAGNOSIS — M5440 Lumbago with sciatica, unspecified side: Secondary | ICD-10-CM

## 2014-09-03 DIAGNOSIS — M48061 Spinal stenosis, lumbar region without neurogenic claudication: Secondary | ICD-10-CM

## 2014-09-03 DIAGNOSIS — I10 Essential (primary) hypertension: Secondary | ICD-10-CM

## 2014-09-03 DIAGNOSIS — M4806 Spinal stenosis, lumbar region: Secondary | ICD-10-CM

## 2014-09-03 DIAGNOSIS — G894 Chronic pain syndrome: Secondary | ICD-10-CM

## 2014-09-03 MED ORDER — MORPHINE SULFATE ER 15 MG PO TBCR: 15.0000 mg | EXTENDED_RELEASE_TABLET | Freq: Two times a day (BID) | ORAL | Status: DC

## 2014-09-03 NOTE — Progress Notes (Deleted)
Pre visit review using our clinic review tool, if applicable. No additional management support is needed unless otherwise documented below in the visit note. 

## 2014-09-04 NOTE — Assessment & Plan Note (Signed)
Chronic  Potential benefits of a long term opioids use as well as potential risks (i.e. addiction risk, apnea etc) and complications (i.e. Somnolence, constipation and others) were explained to the patient and were aknowledged. Continue with current prescription therapy as reflected on the Med list.  

## 2014-09-04 NOTE — Assessment & Plan Note (Signed)
Multi-factorial: see problem list for details of work up: cervical disc disease, hand pain, spinal stenosis and low back pain.  Medication MS contin - tolerated well. Reliable in refills and use.   Needs Controlled Sub Contract (Dec '14). Low risk potential patient with long standing treatment record w/o problems   Potential benefits of a long term opioids use as well as potential risks (i.e. addiction risk, apnea etc) and complications (i.e. Somnolence, constipation and others) were explained to the patient and were aknowledged.

## 2014-09-04 NOTE — Progress Notes (Signed)
   Subjective:    HPI   The patient presents for a follow-up of  chronic hypertension, chronic dyslipidemia, anemia, chronic pain controlled with medicines    Review of Systems  Constitutional: Negative for activity change, appetite change, fatigue and unexpected weight change.  HENT: Negative for congestion, mouth sores and sinus pressure.   Eyes: Negative for visual disturbance.  Respiratory: Negative for chest tightness.   Gastrointestinal: Negative for nausea.  Genitourinary: Negative for frequency, difficulty urinating and vaginal pain.  Musculoskeletal: Negative for back pain and gait problem.  Skin: Negative for pallor and rash.  Neurological: Negative for dizziness, tremors, weakness and numbness.  Psychiatric/Behavioral: Negative for confusion and sleep disturbance.       Objective:   Physical Exam  Constitutional: She appears well-developed. No distress.  HENT:  Head: Normocephalic.  Right Ear: External ear normal.  Left Ear: External ear normal.  Nose: Nose normal.  Mouth/Throat: Oropharynx is clear and moist.  Eyes: Conjunctivae are normal. Pupils are equal, round, and reactive to light. Right eye exhibits no discharge. Left eye exhibits no discharge.  Neck: Normal range of motion. Neck supple. No JVD present. No tracheal deviation present. No thyromegaly present.  Cardiovascular: Normal rate, regular rhythm and normal heart sounds.   Pulmonary/Chest: No stridor. No respiratory distress. She has no wheezes.  Abdominal: Soft. Bowel sounds are normal. She exhibits no distension and no mass. There is no tenderness. There is no rebound and no guarding.  Musculoskeletal: She exhibits no edema or tenderness.  Lymphadenopathy:    She has no cervical adenopathy.  Neurological: She displays normal reflexes. No cranial nerve deficit. She exhibits normal muscle tone. Coordination normal.  Skin: No rash noted. No erythema.  Psychiatric: She has a normal mood and affect.  Her behavior is normal. Judgment and thought content normal.  Looks tired  Lab Results  Component Value Date   WBC 6.0 02/19/2014   HGB 12.5 02/19/2014   HCT 37.0 02/19/2014   PLT 245.0 02/19/2014   GLUCOSE 95 02/19/2014   CHOL 158 02/19/2014   TRIG 108.0 02/19/2014   HDL 67.70 02/19/2014   LDLCALC 69 02/19/2014   ALT 19 02/19/2014   AST 24 02/19/2014   NA 141 02/19/2014   K 3.7 02/19/2014   CL 102 02/19/2014   CREATININE 1.1 02/19/2014   BUN 16 02/19/2014   CO2 30 02/19/2014   TSH 1.17 02/02/2014    A complex case     Assessment & Plan:

## 2014-09-04 NOTE — Assessment & Plan Note (Signed)
Continue with current prescription therapy as reflected on the Med list.  

## 2014-09-07 ENCOUNTER — Ambulatory Visit: Payer: Commercial Managed Care - HMO | Admitting: Internal Medicine

## 2014-09-26 ENCOUNTER — Telehealth: Payer: Self-pay | Admitting: Internal Medicine

## 2014-09-26 NOTE — Telephone Encounter (Signed)
Unfortunately, Dr. Katrinka BlazingSmith does not have anything available today or tomorrow. It sounds like the pt would be better off seeing Dr. Yetta BarreJones tomorrow @ 2pm.

## 2014-09-26 NOTE — Telephone Encounter (Signed)
[facility].  She has a lot of pain in her left knee with pain radiating up.  She has an appt with [name] [name] tomorrow at 2:00pm but would like to have sooner if possible.  Would we be able to possibly work her in with [place]?

## 2014-09-27 ENCOUNTER — Encounter: Payer: Self-pay | Admitting: Internal Medicine

## 2014-09-27 ENCOUNTER — Ambulatory Visit (INDEPENDENT_AMBULATORY_CARE_PROVIDER_SITE_OTHER): Payer: Commercial Managed Care - HMO | Admitting: Internal Medicine

## 2014-09-27 ENCOUNTER — Ambulatory Visit (INDEPENDENT_AMBULATORY_CARE_PROVIDER_SITE_OTHER)
Admission: RE | Admit: 2014-09-27 | Discharge: 2014-09-27 | Disposition: A | Discharge status: Home / Self Care | Payer: Commercial Managed Care - HMO | Source: Ambulatory Visit | Attending: Internal Medicine | Admitting: Internal Medicine

## 2014-09-27 VITALS — BP 120/80 | HR 80 | Temp 98.2°F | Ht 68.0 in | Wt 177.0 lb

## 2014-09-27 DIAGNOSIS — M1712 Unilateral primary osteoarthritis, left knee: Secondary | ICD-10-CM

## 2014-09-27 DIAGNOSIS — M25562 Pain in left knee: Secondary | ICD-10-CM

## 2014-09-27 MED ORDER — DICLOFENAC 18 MG PO CAPS: 1.0000 | ORAL_CAPSULE | Freq: Three times a day (TID) | ORAL | Status: DC | PRN

## 2014-09-27 MED ORDER — METHYLPREDNISOLONE ACETATE 40 MG/ML IJ SUSP
40.0000 mg | Freq: Once | INTRAMUSCULAR | Status: AC
  Administered 2014-09-27: 40 mg via INTRA_ARTICULAR

## 2014-09-27 NOTE — Progress Notes (Signed)
Pre visit review using our clinic review tool, if applicable. No additional management support is needed unless otherwise documented below in the visit note. 

## 2014-09-27 NOTE — Patient Instructions (Signed)

## 2014-09-28 ENCOUNTER — Ambulatory Visit: Payer: Commercial Managed Care - HMO | Admitting: Internal Medicine

## 2014-09-28 NOTE — Assessment & Plan Note (Signed)
She may benefit from TKR, will refer to ortho

## 2014-09-28 NOTE — Assessment & Plan Note (Signed)
Depo-medrol injection today Cont current meds Ortho referral to consider TKR

## 2014-09-28 NOTE — Progress Notes (Signed)
   Subjective:    Patient ID: [name] [name], female    DOB: January 27, 1946, 68 y.o.   MRN: [id number]  Arthritis Presents for follow-up visit. The disease course has been worsening. The condition has lasted for 3 years. She complains of pain. She reports no stiffness, joint swelling or joint warmth. Affected locations include the left knee. Her pain is at a severity of 3/10. Associated symptoms include pain at night and pain while resting. Pertinent negatives include no diarrhea, dry eyes, dry mouth, dysuria, fatigue, fever, rash, Raynaud's syndrome, uveitis or weight loss. Her pertinent risk factors include overuse. Past treatments include acetaminophen, an opioid and NSAIDs. The treatment provided moderate relief. Factors aggravating her arthritis include activity.      Review of Systems  Constitutional: Negative for fever, weight loss and fatigue.  Gastrointestinal: Negative for diarrhea.  Genitourinary: Negative for dysuria.  Musculoskeletal: Positive for arthritis. Negative for joint swelling and stiffness.  Skin: Negative for rash.  All other systems reviewed and are negative.      Objective:   Physical Exam  Constitutional: She is oriented to person, place, and time. She appears well-developed and well-nourished.  Non-toxic appearance. She does not have a sickly appearance. She does not appear ill. No distress.  HENT:  Head: Normocephalic and atraumatic.  Mouth/Throat: Oropharynx is clear and moist. No oropharyngeal exudate.  Eyes: Conjunctivae are normal. Right eye exhibits no discharge. Left eye exhibits no discharge. No scleral icterus.  Neck: Normal range of motion. Neck supple. No JVD present. No tracheal deviation present. No thyromegaly present.  Cardiovascular: Normal rate, regular rhythm, normal heart sounds and intact distal pulses.  Exam reveals no gallop and no friction rub.   No murmur heard. Pulmonary/Chest: Effort normal and breath sounds normal. No stridor. No  respiratory distress. She has no wheezes. She has no rales. She exhibits no tenderness.  Abdominal: Soft. Bowel sounds are normal. She exhibits no distension and no mass. There is no tenderness. There is no rebound and no guarding.  Musculoskeletal:       Left knee: She exhibits deformity (Severe DJD and valgus deformity). She exhibits normal range of motion, no swelling, no effusion, no ecchymosis, no laceration, no erythema, normal alignment, no LCL laxity, normal patellar mobility and no bony tenderness. Tenderness found. Medial joint line tenderness noted.  Lt knee cleaned with betadine, then prepped and draped, Biofreeze applied, the joint space was entered from the medial approach. 1.5 in 23 gauge needle was used to inject 40 mg depo-medrol IA and 1 cc plain 0.5% plain marcaine into the joint. She tolerated this well. No complications noted.  Lymphadenopathy:    She has no cervical adenopathy.  Neurological: She is oriented to person, place, and time.  Skin: Skin is warm and dry. No rash noted. She is not diaphoretic. No erythema. No pallor.  Vitals reviewed.         Assessment & Plan:

## 2014-10-02 ENCOUNTER — Telehealth: Payer: Self-pay | Admitting: Internal Medicine

## 2014-10-02 NOTE — Telephone Encounter (Signed)
Patient is requesting results of xray °

## 2014-10-04 ENCOUNTER — Other Ambulatory Visit: Payer: Self-pay | Admitting: *Deleted

## 2014-10-04 MED ORDER — METOPROLOL TARTRATE 50 MG PO TABS: 100.0000 mg | ORAL_TABLET | Freq: Two times a day (BID) | ORAL | Status: DC

## 2014-10-04 NOTE — Telephone Encounter (Signed)
Please call and inform she does have advanced arthritis which they noted was much worsened since film in 2007.

## 2014-10-04 NOTE — Telephone Encounter (Signed)
Notified pt with md respomnse...Raechel Chute/lmb

## 2014-10-04 NOTE — Telephone Encounter (Signed)
Pt states she will be out of her metoprolol on next Thurs. Would [facility]. Inform pt will send...Raechel Chute/lmb

## 2014-10-08 ENCOUNTER — Telehealth: Payer: Self-pay | Admitting: Internal Medicine

## 2014-10-08 NOTE — Telephone Encounter (Signed)
Use Zorvolex Use ice, sports cream Thx

## 2014-10-08 NOTE — Telephone Encounter (Signed)
Pt called in and said that she has setup an appt with the surgeon on Dec 31st at 9:45.  She said that she is in pain and the meds that Dr Yetta Barrejones gave her is not helping.  She wanted to know if there is anything more he could do for her until she could get to her appt on the 31st?

## 2014-10-09 NOTE — Telephone Encounter (Signed)
Pt informed

## 2014-10-12 ENCOUNTER — Telehealth: Payer: Self-pay | Admitting: Internal Medicine

## 2014-10-12 DIAGNOSIS — M25562 Pain in left knee: Secondary | ICD-10-CM

## 2014-10-12 DIAGNOSIS — M1712 Unilateral primary osteoarthritis, left knee: Secondary | ICD-10-CM

## 2014-10-12 NOTE — Telephone Encounter (Signed)
Patient states scripts were cancelled with [facility].  She states [facility] is still her pharmacy.  She needs them all sent back.  Patient is also requesting zorvolex to be added to her list with insurance to be covered?  She is also requesting 90 day supply.  Patient would also like to know if there are samples.

## 2014-10-15 MED ORDER — PANTOPRAZOLE SODIUM 40 MG PO TBEC
40.0000 mg | DELAYED_RELEASE_TABLET | Freq: Every day | ORAL | Status: DC
Start: 2014-10-15 — End: 2015-11-12

## 2014-10-15 MED ORDER — LOSARTAN POTASSIUM-HCTZ 100-25 MG PO TABS: 1.0000 | ORAL_TABLET | Freq: Every day | ORAL | Status: DC

## 2014-10-15 MED ORDER — DICLOFENAC 18 MG PO CAPS: 1.0000 | ORAL_CAPSULE | Freq: Three times a day (TID) | ORAL | Status: DC | PRN

## 2014-10-15 MED ORDER — METOPROLOL TARTRATE 50 MG PO TABS: 100.0000 mg | ORAL_TABLET | Freq: Two times a day (BID) | ORAL | Status: DC

## 2014-10-15 MED ORDER — METHIMAZOLE 10 MG PO TABS
10.0000 mg | ORAL_TABLET | Freq: Every day | ORAL | Status: DC
Start: 2014-10-15 — End: 2015-11-12

## 2014-10-15 NOTE — Telephone Encounter (Signed)
Pt calling upset that she has not rec'd a call back regarding that status of scripts, see 12/18 note. Pls let pt know the status of this

## 2014-10-15 NOTE — Telephone Encounter (Signed)
Called pt to verify msg's below. Pt stated that when she called [facility] was told by them that Dr. Posey ReaPlotnikov had cancel all prescriptions and she is needing refills. Inform pt per chart there is no notation about canceling prescriptions, but [facility]...Raechel Chute/lmb

## 2014-10-18 ENCOUNTER — Telehealth: Payer: Self-pay | Admitting: Internal Medicine

## 2014-10-18 NOTE — Telephone Encounter (Signed)
Patient states [facility] called her to notify her that there needs to be a PA processed on zorvolex

## 2014-10-24 NOTE — Telephone Encounter (Signed)
Form being [facility].

## 2014-11-09 ENCOUNTER — Ambulatory Visit (INDEPENDENT_AMBULATORY_CARE_PROVIDER_SITE_OTHER): Payer: PPO | Admitting: Internal Medicine

## 2014-11-09 ENCOUNTER — Encounter: Payer: Self-pay | Admitting: Internal Medicine

## 2014-11-09 VITALS — BP 140/90 | HR 67 | Temp 98.3°F | Wt 179.0 lb

## 2014-11-09 DIAGNOSIS — E0590 Thyrotoxicosis, unspecified without thyrotoxic crisis or storm: Secondary | ICD-10-CM

## 2014-11-09 DIAGNOSIS — G894 Chronic pain syndrome: Secondary | ICD-10-CM

## 2014-11-09 DIAGNOSIS — M2550 Pain in unspecified joint: Secondary | ICD-10-CM

## 2014-11-09 MED ORDER — MORPHINE SULFATE ER 15 MG PO TBCR: 15.0000 mg | EXTENDED_RELEASE_TABLET | Freq: Two times a day (BID) | ORAL | Status: DC

## 2014-11-09 MED ORDER — GABAPENTIN 100 MG PO CAPS: 100.0000 mg | ORAL_CAPSULE | Freq: Three times a day (TID) | ORAL | Status: DC

## 2014-11-09 NOTE — Progress Notes (Signed)
Pre visit review using our clinic review tool, if applicable. No additional management support is needed unless otherwise documented below in the visit note. 

## 2014-11-09 NOTE — Progress Notes (Signed)
   Subjective:    HPI  C/o severe L knee pain, L leg pain, B arms pain - seen at [facility] by a PA, knee injections are planned. C/o h/o lupus. She is upset about doctors not being able to relieve her pain... Pain in LLE, B arms  The patient presents for a follow-up of  chronic hypertension, chronic dyslipidemia, anemia, chronic pain controlled with medicines    Review of Systems  Constitutional: Negative for activity change, appetite change, fatigue and unexpected weight change.  HENT: Negative for congestion, mouth sores and sinus pressure.   Eyes: Negative for visual disturbance.  Respiratory: Negative for chest tightness.   Gastrointestinal: Negative for nausea.  Genitourinary: Negative for frequency, difficulty urinating and vaginal pain.  Musculoskeletal: Negative for back pain and gait problem.  Skin: Negative for pallor and rash.  Neurological: Negative for dizziness, tremors, weakness and numbness.  Psychiatric/Behavioral: Negative for confusion and sleep disturbance.       Objective:   Physical Exam  Constitutional: She appears well-developed. No distress.  HENT:  Head: Normocephalic.  Right Ear: External ear normal.  Left Ear: External ear normal.  Nose: Nose normal.  Mouth/Throat: Oropharynx is clear and moist.  Eyes: Conjunctivae are normal. Pupils are equal, round, and reactive to light. Right eye exhibits no discharge. Left eye exhibits no discharge.  Neck: Normal range of motion. Neck supple. No JVD present. No tracheal deviation present. No thyromegaly present.  Cardiovascular: Normal rate, regular rhythm and normal heart sounds.   Pulmonary/Chest: No stridor. No respiratory distress. She has no wheezes.  Abdominal: Soft. Bowel sounds are normal. She exhibits no distension and no mass. There is no tenderness. There is no rebound and no guarding.  Musculoskeletal: She exhibits no edema or tenderness.  Lymphadenopathy:    She has no cervical adenopathy.   Neurological: She displays normal reflexes. No cranial nerve deficit. She exhibits normal muscle tone. Coordination normal.  Skin: No rash noted. No erythema.  Psychiatric: She has a normal mood and affect. Her behavior is normal. Judgment and thought content normal.    Lab Results  Component Value Date   WBC 6.0 02/19/2014   HGB 12.5 02/19/2014   HCT 37.0 02/19/2014   PLT 245.0 02/19/2014   GLUCOSE 95 02/19/2014   CHOL 158 02/19/2014   TRIG 108.0 02/19/2014   HDL 67.70 02/19/2014   LDLCALC 69 02/19/2014   ALT 19 02/19/2014   AST 24 02/19/2014   NA 141 02/19/2014   K 3.7 02/19/2014   CL 102 02/19/2014   CREATININE 1.1 02/19/2014   BUN 16 02/19/2014   CO2 30 02/19/2014   TSH 1.17 02/02/2014        Assessment & Plan:  Patient ID: Donnal DebarVivian Alicea, female   DOB: 04/10/1946, 69 y.o.   MRN: [id number]

## 2014-11-10 ENCOUNTER — Encounter: Payer: Self-pay | Admitting: Internal Medicine

## 2014-11-10 NOTE — Assessment & Plan Note (Signed)
Continue with current prescription therapy as reflected on the Med list.  

## 2014-11-10 NOTE — Assessment & Plan Note (Signed)
Continue with current prescription therapy as reflected on the Med list.  Potential benefits of a long term opioids use as well as potential risks (i.e. addiction risk, apnea etc) and complications (i.e. Somnolence, constipation and others) were explained to the patient and were aknowledged.  

## 2014-11-10 NOTE — Assessment & Plan Note (Signed)
Chronic ? 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 Rhematology consult was suggested Continue with current prescription therapy as reflected on the Med list.

## 2014-11-13 ENCOUNTER — Telehealth: Payer: Self-pay | Admitting: Internal Medicine

## 2014-11-13 DIAGNOSIS — M2550 Pain in unspecified joint: Secondary | ICD-10-CM

## 2014-11-13 NOTE — Telephone Encounter (Signed)
Patient called highly upset because she feels as though we are not helping her to address the source of her pain. She was referred to ortho specialist, but they told her that there is "something else" going on in her leg other than arthritis. Is there another specialty we can refer the patient to in order to possibly DX her issues? Please advise.

## 2014-11-15 ENCOUNTER — Other Ambulatory Visit (INDEPENDENT_AMBULATORY_CARE_PROVIDER_SITE_OTHER): Payer: PPO

## 2014-11-15 ENCOUNTER — Telehealth: Payer: Self-pay | Admitting: Internal Medicine

## 2014-11-15 DIAGNOSIS — I10 Essential (primary) hypertension: Secondary | ICD-10-CM

## 2014-11-15 LAB — BASIC METABOLIC PANEL
BUN: 19 mg/dL (ref 6–23)
CO2: 29 mEq/L (ref 19–32)
Calcium: 9.5 mg/dL (ref 8.4–10.5)
Chloride: 103 mEq/L (ref 96–112)
Creatinine, Ser: 1.21 mg/dL — ABNORMAL HIGH (ref 0.40–1.20)
GFR: 56.53 mL/min — ABNORMAL LOW (ref >60.00)
GLUCOSE: 85 mg/dL (ref 70–99)
Potassium: 4.6 mEq/L (ref 3.5–5.1)
Sodium: 139 mEq/L (ref 135–145)

## 2014-11-15 NOTE — Telephone Encounter (Signed)
Ok BMET Dx HTN Thx

## 2014-11-15 NOTE — Telephone Encounter (Signed)
Notified pt md ok lab order has been place...Raechel Chute/lmb

## 2014-11-15 NOTE — Telephone Encounter (Signed)
Pt called in and would like to have a order put in to check her potassium?

## 2014-11-15 NOTE — Telephone Encounter (Signed)
She could see a rheumatologist if she has not seen one yet Thx

## 2014-11-19 ENCOUNTER — Ambulatory Visit: Payer: Commercial Managed Care - HMO | Admitting: Internal Medicine

## 2014-11-19 NOTE — Telephone Encounter (Signed)
OK Rhem ref Thx

## 2014-11-19 NOTE — Telephone Encounter (Signed)
Pt informed. She states ortho wants to do a lubricating injection She does not have her copay money at this time. She was supposed to start this Wednesday. Her pain is more in her thigh now. Pt wants to see rheumatologist. Please advise.

## 2014-12-04 ENCOUNTER — Ambulatory Visit: Payer: Commercial Managed Care - HMO | Admitting: Internal Medicine

## 2015-03-18 ENCOUNTER — Telehealth: Payer: Self-pay | Admitting: Internal Medicine

## 2015-03-18 NOTE — Telephone Encounter (Signed)
Patient requesting refill for morphine (MS CONTIN) 15 MG 12 hr tablet [[id number]][[id number]]

## 2015-03-19 MED ORDER — MORPHINE SULFATE ER 15 MG PO TBCR: 15.0000 mg | EXTENDED_RELEASE_TABLET | Freq: Two times a day (BID) | ORAL | Status: DC

## 2015-03-19 NOTE — Telephone Encounter (Signed)
Pt informed Rx printed/upfront for p/u. She knows she needs to schedule OV.

## 2015-03-19 NOTE — Telephone Encounter (Signed)
OK to fill this prescription with additional refills x0 OV q 3 mo Thank you!  

## 2015-03-28 ENCOUNTER — Encounter: Payer: Self-pay | Admitting: Internal Medicine

## 2015-03-28 ENCOUNTER — Other Ambulatory Visit (INDEPENDENT_AMBULATORY_CARE_PROVIDER_SITE_OTHER): Payer: PPO

## 2015-03-28 ENCOUNTER — Ambulatory Visit (INDEPENDENT_AMBULATORY_CARE_PROVIDER_SITE_OTHER): Payer: PPO | Admitting: Internal Medicine

## 2015-03-28 VITALS — BP 118/70 | HR 74 | Wt 186.0 lb

## 2015-03-28 DIAGNOSIS — M5440 Lumbago with sciatica, unspecified side: Secondary | ICD-10-CM

## 2015-03-28 DIAGNOSIS — I10 Essential (primary) hypertension: Secondary | ICD-10-CM | POA: Diagnosis not present

## 2015-03-28 DIAGNOSIS — R202 Paresthesia of skin: Secondary | ICD-10-CM

## 2015-03-28 DIAGNOSIS — G4710 Hypersomnia, unspecified: Secondary | ICD-10-CM

## 2015-03-28 DIAGNOSIS — G4700 Insomnia, unspecified: Secondary | ICD-10-CM | POA: Diagnosis not present

## 2015-03-28 LAB — BASIC METABOLIC PANEL
BUN: 20 mg/dL (ref 6–23)
CALCIUM: 9.6 mg/dL (ref 8.4–10.5)
CO2: 26 mEq/L (ref 19–32)
Chloride: 100 mEq/L (ref 96–112)
Creatinine, Ser: 1.25 mg/dL — ABNORMAL HIGH (ref 0.40–1.20)
GFR: 54.39 mL/min — ABNORMAL LOW (ref >60.00)
Glucose, Bld: 98 mg/dL (ref 70–99)
POTASSIUM: 4.3 meq/L (ref 3.5–5.1)
Sodium: 137 mEq/L (ref 135–145)

## 2015-03-28 LAB — CBC WITH DIFFERENTIAL/PLATELET
BASOS ABS: 0 10*3/uL (ref 0.0–0.1)
Basophils Relative: 0.6 % (ref 0.0–3.0)
Eosinophils Absolute: 0.2 10*3/uL (ref 0.0–0.7)
Eosinophils Relative: 2.2 % (ref 0.0–5.0)
HCT: 38.1 % (ref 36.0–46.0)
Hemoglobin: 12.8 g/dL (ref 12.0–15.0)
LYMPHS ABS: 2.6 10*3/uL (ref 0.7–4.0)
LYMPHS PCT: 35.4 % (ref 12.0–46.0)
MCHC: 33.5 g/dL (ref 30.0–36.0)
MCV: 88.8 fl (ref 78.0–100.0)
MONOS PCT: 8.3 % (ref 3.0–12.0)
Monocytes Absolute: 0.6 10*3/uL (ref 0.1–1.0)
Neutro Abs: 3.9 10*3/uL (ref 1.4–7.7)
Neutrophils Relative %: 53.5 % (ref 43.0–77.0)
Platelets: 252 10*3/uL (ref 150.0–400.0)
RBC: 4.3 Mil/uL (ref 3.87–5.11)
RDW: 14.9 % (ref 11.5–15.5)
WBC: 7.3 10*3/uL (ref 4.0–10.5)

## 2015-03-28 LAB — HEPATIC FUNCTION PANEL
ALBUMIN: 4.1 g/dL (ref 3.5–5.2)
ALT: 11 U/L (ref 0–35)
AST: 18 U/L (ref 0–37)
Alkaline Phosphatase: 93 U/L (ref 39–117)
BILIRUBIN DIRECT: 0.1 mg/dL (ref 0.0–0.3)
Total Bilirubin: 0.6 mg/dL (ref 0.2–1.2)
Total Protein: 7.3 g/dL (ref 6.0–8.3)

## 2015-03-28 LAB — SEDIMENTATION RATE: Sed Rate: 15 mm/hr (ref 0–22)

## 2015-03-28 LAB — TSH: TSH: 1.16 u[IU]/mL (ref 0.35–4.50)

## 2015-03-28 LAB — VITAMIN B12: VITAMIN B 12: 874 pg/mL (ref 211–911)

## 2015-03-28 MED ORDER — MORPHINE SULFATE ER 15 MG PO TBCR: 15.0000 mg | EXTENDED_RELEASE_TABLET | Freq: Two times a day (BID) | ORAL | Status: DC

## 2015-03-28 NOTE — Patient Instructions (Signed)
Cut back on carbs 

## 2015-03-28 NOTE — Assessment & Plan Note (Signed)
Chronic   Potential benefits of a long term opioids use as well as potential risks (i.e. addiction risk, apnea etc) and complications (i.e. Somnolence, constipation and others) were explained to the patient and were aknowledged. MSContin

## 2015-03-28 NOTE — Assessment & Plan Note (Signed)
Chronic  Metoprolol, Losartan HCT

## 2015-03-28 NOTE — Progress Notes (Signed)
   Subjective:    HPI  C/o taking naps lately. Not tired...  F/u severe L knee pain, L leg pain, B arms pain - seen at [facility] by a PA, knee injections are planned. H/o lupus per pt. She is upset about doctors not being able to relieve her pain... Pain in LLE, B arms  The patient presents for a follow-up of  chronic hypertension, chronic dyslipidemia, anemia, chronic pain controlled with medicines  Wt Readings from Last 3 Encounters:  03/28/15 186 lb (84.369 kg)  11/09/14 179 lb (81.194 kg)  09/27/14 177 lb (80.287 kg)   BP Readings from Last 3 Encounters:  03/28/15 118/70  11/09/14 140/90  09/27/14 120/80      Review of Systems  Constitutional: Negative for activity change, appetite change, fatigue and unexpected weight change.  HENT: Negative for congestion, mouth sores and sinus pressure.   Eyes: Negative for visual disturbance.  Respiratory: Negative for chest tightness.   Gastrointestinal: Negative for nausea.  Genitourinary: Negative for frequency, difficulty urinating and vaginal pain.  Musculoskeletal: Negative for back pain and gait problem.  Skin: Negative for pallor and rash.  Neurological: Negative for dizziness, tremors, weakness and numbness.  Psychiatric/Behavioral: Negative for confusion and sleep disturbance.       Objective:   Physical Exam  Constitutional: She appears well-developed. No distress.  HENT:  Head: Normocephalic.  Right Ear: External ear normal.  Left Ear: External ear normal.  Nose: Nose normal.  Mouth/Throat: Oropharynx is clear and moist.  Eyes: Conjunctivae are normal. Pupils are equal, round, and reactive to light. Right eye exhibits no discharge. Left eye exhibits no discharge.  Neck: Normal range of motion. Neck supple. No JVD present. No tracheal deviation present. No thyromegaly present.  Cardiovascular: Normal rate, regular rhythm and normal heart sounds.   Pulmonary/Chest: No stridor. No respiratory distress. She has no  wheezes.  Abdominal: Soft. Bowel sounds are normal. She exhibits no distension and no mass. There is no tenderness. There is no rebound and no guarding.  Musculoskeletal: She exhibits no edema or tenderness.  Lymphadenopathy:    She has no cervical adenopathy.  Neurological: She displays normal reflexes. No cranial nerve deficit. She exhibits normal muscle tone. Coordination normal.  Skin: No rash noted. No erythema.  Psychiatric: She has a normal mood and affect. Her behavior is normal. Judgment and thought content normal.    Lab Results  Component Value Date   WBC 6.0 02/19/2014   HGB 12.5 02/19/2014   HCT 37.0 02/19/2014   PLT 245.0 02/19/2014   GLUCOSE 85 11/15/2014   CHOL 158 02/19/2014   TRIG 108.0 02/19/2014   HDL 67.70 02/19/2014   LDLCALC 69 02/19/2014   ALT 19 02/19/2014   AST 24 02/19/2014   NA 139 11/15/2014   K 4.6 11/15/2014   CL 103 11/15/2014   CREATININE 1.21* 11/15/2014   BUN 19 11/15/2014   CO2 29 11/15/2014   TSH 1.17 02/02/2014        Assessment & Plan:

## 2015-03-28 NOTE — Assessment & Plan Note (Signed)
Labs

## 2015-03-28 NOTE — Progress Notes (Signed)
Pre visit review using our clinic review tool, if applicable. No additional management support is needed unless otherwise documented below in the visit note. 

## 2015-03-28 NOTE — Assessment & Plan Note (Signed)
Better  

## 2015-04-05 ENCOUNTER — Telehealth: Payer: Self-pay

## 2015-04-05 NOTE — Telephone Encounter (Signed)
Patient states she had mammogram last year (2015), she is calling doctors office where she had mammogram to get exact date and she will call our office back with exact date to update records

## 2015-04-18 ENCOUNTER — Encounter: Payer: Self-pay | Admitting: Gastroenterology

## 2015-06-25 ENCOUNTER — Telehealth: Payer: Self-pay

## 2015-06-25 NOTE — Telephone Encounter (Signed)
Patient called to educate on Medicare Wellness apt. LVM for the patient to call back to educate and schedule for wellness visit.   

## 2015-06-26 ENCOUNTER — Encounter: Payer: Self-pay | Admitting: Internal Medicine

## 2015-06-28 ENCOUNTER — Ambulatory Visit (INDEPENDENT_AMBULATORY_CARE_PROVIDER_SITE_OTHER)
Admission: RE | Admit: 2015-06-28 | Discharge: 2015-06-28 | Disposition: A | Discharge status: Home / Self Care | Payer: PPO | Source: Ambulatory Visit | Attending: Internal Medicine | Admitting: Internal Medicine

## 2015-06-28 ENCOUNTER — Inpatient Hospital Stay: Admission: RE | Admit: 2015-06-28 | Payer: PPO | Source: Ambulatory Visit

## 2015-06-28 ENCOUNTER — Ambulatory Visit (INDEPENDENT_AMBULATORY_CARE_PROVIDER_SITE_OTHER): Payer: PPO

## 2015-06-28 VITALS — BP 122/70 | Ht 67.0 in | Wt 187.2 lb

## 2015-06-28 DIAGNOSIS — Z23 Encounter for immunization: Secondary | ICD-10-CM

## 2015-06-28 DIAGNOSIS — Z0000 Encounter for general adult medical examination without abnormal findings: Secondary | ICD-10-CM

## 2015-06-28 DIAGNOSIS — Z780 Asymptomatic menopausal state: Secondary | ICD-10-CM

## 2015-06-28 NOTE — Patient Instructions (Addendum)
[name] [name] , Thank you for taking time to come for your Medicare Wellness Visit. I appreciate your ongoing commitment to your health goals. Please review the following plan we discussed and let me know if I can assist you in the future.   Will have dexa scan prior to seeing Dr. Alain Marion Will take flu vaccine today   These are the goals we discussed: Goals    . Weight < 175 lb (79.379 kg)     [facility] and walking as tolerated Water exercise may be useful  Weight: Tracking; portion plate; monitoring fat intake on the label; drinking water; Watch for foods with High fructose corn syrup  Exercise;  (recommended 30 minutes; 5 days a week) BMI reviewed and educated Educated regarding online nutrition programs as GumSearch.nl and http://vang.com/; fit65m;            This is a list of the screening recommended for you and due dates:  Health Maintenance  Topic Date Due  .  Hepatitis C: One time screening is recommended by Center for Disease Control  (CDC) for  adults born from 139through 1967.   110-Feb-1947 . Shingles Vaccine  08/19/2004  . DEXA scan (bone density measurement)  08/19/2009  . Mammogram  11/12/2012  . Flu Shot  05/27/2015  . Colon Cancer Screening  12/23/2020  . Tetanus Vaccine  05/05/2023  . Pneumonia vaccines  Completed    Osteoporosis Throughout your life, your body breaks down old bone and replaces it with new bone. As you get older, your body does not replace bone as quickly as it breaks it down. By the age of 362years, most people begin to gradually lose bone because of the imbalance between bone loss and replacement. Some people lose more bone than others. Bone loss beyond a specified normal degree is considered osteoporosis.  Osteoporosis affects the strength and durability of your bones. The inside of the ends of your bones and your flat bones, like the bones of your pelvis, look like honeycomb, filled with  tiny open spaces. As bone loss occurs, your bones become less dense. This means that the open spaces inside your bones become bigger and the walls between these spaces become thinner. This makes your bones weaker. Bones of a person with osteoporosis can become so weak that they can break (fracture) during minor accidents, such as a simple fall. CAUSES  The following factors have been associated with the development of osteoporosis:  Smoking.  Drinking more than 2 alcoholic drinks several days per week.  Long-term use of certain medicines:  Corticosteroids.  Chemotherapy medicines.  Thyroid medicines.  Antiepileptic medicines.  Gonadal hormone suppression medicine.  Immunosuppression medicine.  Being underweight.  Lack of physical activity.  Lack of exposure to the sun. This can lead to vitamin D deficiency.  Certain medical conditions:  Certain inflammatory bowel diseases, such as Crohn disease and ulcerative colitis.  Diabetes.  Hyperthyroidism.  Hyperparathyroidism. RISK FACTORS Anyone can develop osteoporosis. However, the following factors can increase your risk of developing osteoporosis:  Gender--Women are at higher risk than men.  Age--Being older than 50 years increases your risk.  Ethnicity--White and Asian people have an increased risk.  Weight --Being extremely underweight can increase your risk of osteoporosis.  Family history of osteoporosis--Having a family member who has developed osteoporosis can increase your risk. SYMPTOMS  Usually, people with osteoporosis have no symptoms.  DIAGNOSIS  Signs during a physical  exam that may prompt your caregiver to suspect osteoporosis include:  Decreased height. This is usually caused by the compression of the bones that form your spine (vertebrae) because they have weakened and become fractured.  A curving or rounding of the upper back (kyphosis). To confirm signs of osteoporosis, your caregiver may request  a procedure that uses 2 low-dose X-ray beams with different levels of energy to measure your bone mineral density (dual-energy X-ray absorptiometry [DXA]). Also, your caregiver may check your level of vitamin D. TREATMENT  The goal of osteoporosis treatment is to strengthen bones in order to decrease the risk of bone fractures. There are different types of medicines available to help achieve this goal. Some of these medicines work by slowing the processes of bone loss. Some medicines work by increasing bone density. Treatment also involves making sure that your levels of calcium and vitamin D are adequate. PREVENTION  There are things you can do to help prevent osteoporosis. Adequate intake of calcium and vitamin D can help you achieve optimal bone mineral density. Regular exercise can also help, especially resistance and weight-bearing activities. If you smoke, quitting smoking is an important part of osteoporosis prevention. MAKE SURE YOU:  Understand these instructions.  Will watch your condition.  Will get help right away if you are not doing well or get worse. FOR MORE INFORMATION www.osteo.org and EquipmentWeekly.com.ee Document Released: 07/22/2005 Document Revised: 02/06/2013 Document Reviewed: 09/26/2011 [facility] Patient Information 2015 [place], [place]. This information is not intended to replace advice given to you by your health care provider. Make sure you discuss any questions you have with your health care provider.     Why follow it? Research shows. . Those who follow the Mediterranean diet have a reduced risk of heart disease  . The diet is associated with a reduced incidence of Parkinson's and Alzheimer's diseases . People following the diet may have longer life expectancies and lower rates of chronic diseases  . The Dietary Guidelines for Americans recommends the Mediterranean diet as an eating plan to promote health and prevent disease  What Is the Mediterranean Diet?  . Healthy  eating plan based on typical foods and recipes of Mediterranean-style cooking . The diet is primarily a plant based diet; these foods should make up a majority of meals   Starches - Plant based foods should make up a majority of meals - They are an important sources of vitamins, minerals, energy, antioxidants, and fiber - Choose whole grains, foods high in fiber and minimally processed items  - Typical grain sources include wheat, oats, barley, corn, brown rice, bulgar, farro, millet, polenta, couscous  - Various types of beans include chickpeas, lentils, fava beans, black beans, white beans   Fruits  Veggies - Large quantities of antioxidant rich fruits & veggies; 6 or more servings  - Vegetables can be eaten raw or lightly drizzled with oil and cooked  - Vegetables common to the traditional Mediterranean Diet include: artichokes, arugula, beets, broccoli, brussel sprouts, cabbage, carrots, celery, collard greens, cucumbers, eggplant, kale, leeks, lemons, lettuce, mushrooms, okra, onions, peas, peppers, potatoes, pumpkin, radishes, rutabaga, shallots, spinach, sweet potatoes, turnips, zucchini - Fruits common to the Mediterranean Diet include: apples, apricots, avocados, cherries, clementines, dates, figs, grapefruits, grapes, melons, nectarines, oranges, peaches, pears, pomegranates, strawberries, tangerines  Fats - Replace butter and margarine with healthy oils, such as olive oil, canola oil, and tahini  - Limit nuts to no more than a handful a day  - Nuts include walnuts, almonds,  pecans, pistachios, pine nuts  - Limit or avoid candied, honey roasted or heavily salted nuts - Olives are central to the Mediterranean diet - can be eaten whole or used in a variety of dishes   Meats Protein - Limiting red meat: no more than a few times a month - When eating red meat: choose lean cuts and keep the portion to the size of deck of cards - Eggs: approx. 0 to 4 times a week  - Fish and lean poultry: at  least 2 a week  - Healthy protein sources include, chicken, [place], lean beef, lamb - Increase intake of seafood such as tuna, salmon, trout, mackerel, shrimp, scallops - Avoid or limit high fat processed meats such as sausage and bacon  Dairy - Include moderate amounts of low fat dairy products  - Focus on healthy dairy such as fat free yogurt, skim milk, low or reduced fat cheese - Limit dairy products higher in fat such as whole or 2% milk, cheese, ice cream  Alcohol - Moderate amounts of red wine is ok  - No more than 5 oz daily for women (all ages) and men older than age 47  - No more than 10 oz of wine daily for men younger than 36  Other - Limit sweets and other desserts  - Use herbs and spices instead of salt to flavor foods  - Herbs and spices common to the traditional Mediterranean Diet include: basil, bay leaves, chives, cloves, cumin, fennel, garlic, lavender, marjoram, mint, oregano, parsley, pepper, rosemary, sage, savory, sumac, tarragon, thyme   It's not just a diet, it's a lifestyle:  . The Mediterranean diet includes lifestyle factors typical of those in the region  . Foods, drinks and meals are best eaten with others and savored . Daily physical activity is important for overall good health . This could be strenuous exercise like running and aerobics . This could also be more leisurely activities such as walking, housework, yard-work, or taking the stairs . Moderation is the key; a balanced and healthy diet accommodates most foods and drinks . Consider portion sizes and frequency of consumption of certain foods   Meal Ideas & Options:  . Breakfast:  o Whole wheat toast or whole wheat English muffins with peanut butter & hard boiled egg o Steel cut oats topped with apples & cinnamon and skim milk  o Fresh fruit: banana, strawberries, melon, berries, peaches  o Smoothies: strawberries, bananas, greek yogurt, peanut butter o Low fat greek yogurt with blueberries and  granola  o Egg white omelet with spinach and mushrooms o Breakfast couscous: whole wheat couscous, apricots, skim milk, cranberries  . Sandwiches:  o Hummus and grilled vegetables (peppers, zucchini, squash) on whole wheat bread   o Grilled chicken on whole wheat pita with lettuce, tomatoes, cucumbers or tzatziki  o Tuna salad on whole wheat bread: tuna salad made with greek yogurt, olives, red peppers, capers, green onions o Garlic rosemary lamb pita: lamb sauted with garlic, rosemary, salt & pepper; add lettuce, cucumber, greek yogurt to pita - flavor with lemon juice and black pepper  . Seafood:  o Mediterranean grilled salmon, seasoned with garlic, basil, parsley, lemon juice and black pepper o Shrimp, lemon, and spinach whole-grain pasta salad made with low fat greek yogurt  o Seared scallops with lemon orzo  o Seared tuna steaks seasoned salt, pepper, coriander topped with tomato mixture of olives, tomatoes, olive oil, minced garlic, parsley, green onions and cappers  . Meats:  o Herbed greek chicken salad with kalamata olives, cucumber, feta  o Red bell peppers stuffed with spinach, bulgur, lean ground beef (or lentils) & topped with feta   o Kebabs: skewers of chicken, tomatoes, onions, zucchini, squash  o [place] burgers: made with red onions, mint, dill, lemon juice, feta cheese topped with roasted red peppers . Vegetarian o Cucumber salad: cucumbers, artichoke hearts, celery, red onion, feta cheese, tossed in olive oil & lemon juice  o Hummus and whole grain pita points with a greek salad (lettuce, tomato, feta, olives, cucumbers, red onion) o Lentil soup with celery, carrots made with vegetable broth, garlic, salt and pepper  o Tabouli salad: parsley, bulgur, mint, scallions, cucumbers, tomato, radishes, lemon juice, olive oil, salt and pepper.      Bone Densitometry Bone densitometry is a special X-ray that measures your bone density and can be used to help predict your risk of  bone fractures. This test is used to determine bone mineral content and density to diagnose osteoporosis. Osteoporosis is the loss of bone that may cause the bone to become weak. Osteoporosis commonly occurs in women entering menopause. However, it may be found in men and in people with other diseases. PREPARATION FOR TEST No preparation necessary. WHO SHOULD BE TESTED?  All women older than 22.  Postmenopausal women (50 to 39) with risk factors for osteoporosis.  People with a previous fracture caused by normal activities.  People with a small body frame (less than 127 poundsor a body mass index [BMI] of less than 21).  People who have a parent with a hip fracture or history of osteoporosis.  People who smoke.  People who have rheumatoid arthritis.  Anyone who engages in excessive alcohol use (more than 3 drinks most days).  Women who experience early menopause. WHEN SHOULD YOU BE RETESTED? Current guidelines suggest that you should wait at least 2 years before doing a bone density test again if your first test was normal.Recent studies indicated that women with normal bone density may be able to wait a few years before needing to repeat a bone density test. You should discuss this with your caregiver.  NORMAL FINDINGS   Normal: less than standard deviation below normal (greater than -1).  Osteopenia: 1 to 2.5 standard deviations below normal (-1 to -2.5).  Osteoporosis: greater than 2.5 standard deviations below normal (less than -2.5). Test results are reported as a "T score" and a "Z score."The T score is a number that compares your bone density with the bone density of healthy, young women.The Z score is a number that compares your bone density with the scores of women who are the same age, gender, and race.  Ranges for normal findings may vary among different laboratories and hospitals. You should always check with your doctor after having lab work or other tests done to  discuss the meaning of your test results and whether your values are considered within normal limits. MEANING OF TEST  Your caregiver will go over the test results with you and discuss the importance and meaning of your results, as well as treatment options and the need for additional tests if necessary. OBTAINING THE TEST RESULTS It is your responsibility to obtain your test results. Ask the lab or department performing the test when and how you will get your results. Document Released: 11/03/2004 Document Revised: 01/04/2012 Document Reviewed: 11/26/2010 [facility] Patient Information 2015 [place], [place]. This information is not intended to replace advice given to you by your health  care provider. Make sure you discuss any questions you have with your health care provider.  Health Maintenance Adopting a healthy lifestyle and getting preventive care can go a long way to promote health and wellness. Talk with your health care provider about what schedule of regular examinations is right for you. This is a good chance for you to check in with your provider about disease prevention and staying healthy. In between checkups, there are plenty of things you can do on your own. Experts have done a lot of research about which lifestyle changes and preventive measures are most likely to keep you healthy. Ask your health care provider for more information. WEIGHT AND DIET  Eat a healthy diet  Be sure to include plenty of vegetables, fruits, low-fat dairy products, and lean protein.  Do not eat a lot of foods high in solid fats, added sugars, or salt.  Get regular exercise. This is one of the most important things you can do for your health.  Most adults should exercise for at least 150 minutes each week. The exercise should increase your heart rate and make you sweat (moderate-intensity exercise).  Most adults should also do strengthening exercises at least twice a week. This is in addition to the  moderate-intensity exercise.  Maintain a healthy weight  Body mass index (BMI) is a measurement that can be used to identify possible weight problems. It estimates body fat based on height and weight. Your health care provider can help determine your BMI and help you achieve or maintain a healthy weight.  For females 3 years of age and older:   A BMI below 18.5 is considered underweight.  A BMI of 18.5 to 24.9 is normal.  A BMI of 25 to 29.9 is considered overweight.  A BMI of 30 and above is considered obese.  Watch levels of cholesterol and blood lipids  You should start having your blood tested for lipids and cholesterol at 71 years of age, then have this test every 5 years.  You may need to have your cholesterol levels checked more often if:  Your lipid or cholesterol levels are high.  You are older than 71 years of age.  You are at high risk for heart disease.  CANCER SCREENING   Lung Cancer  Lung cancer screening is recommended for adults 68-46 years old who are at high risk for lung cancer because of a history of smoking.  A yearly low-dose CT scan of the lungs is recommended for people who:  Currently smoke.  Have quit within the past 15 years.  Have at least a 30-pack-year history of smoking. A pack year is smoking an average of one pack of cigarettes a day for 1 year.  Yearly screening should continue until it has been 15 years since you quit.  Yearly screening should stop if you develop a health problem that would prevent you from having lung cancer treatment.  Breast Cancer  Practice breast self-awareness. This means understanding how your breasts normally appear and feel.  It also means doing regular breast self-exams. Let your health care provider know about any changes, no matter how small.  If you are in your 20s or 30s, you should have a clinical breast exam (CBE) by a health care provider every 1-3 years as part of a regular health exam.  If  you are 53 or older, have a CBE every year. Also consider having a breast X-ray (mammogram) every year.  If you have a family  history of breast cancer, talk to your health care provider about genetic screening.  If you are at high risk for breast cancer, talk to your health care provider about having an MRI and a mammogram every year.  Breast cancer gene (BRCA) assessment is recommended for women who have family members with BRCA-related cancers. BRCA-related cancers include:  Breast.  Ovarian.  Tubal.  Peritoneal cancers.  Results of the assessment will determine the need for genetic counseling and BRCA1 and BRCA2 testing. Cervical Cancer Routine pelvic examinations to screen for cervical cancer are no longer recommended for nonpregnant women who are considered low risk for cancer of the pelvic organs (ovaries, uterus, and vagina) and who do not have symptoms. A pelvic examination may be necessary if you have symptoms including those associated with pelvic infections. Ask your health care provider if a screening pelvic exam is right for you.   The Pap test is the screening test for cervical cancer for women who are considered at risk.  If you had a hysterectomy for a problem that was not cancer or a condition that could lead to cancer, then you no longer need Pap tests.  If you are older than 65 years, and you have had normal Pap tests for the past 10 years, you no longer need to have Pap tests.  If you have had past treatment for cervical cancer or a condition that could lead to cancer, you need Pap tests and screening for cancer for at least 20 years after your treatment.  If you no longer get a Pap test, assess your risk factors if they change (such as having a new sexual partner). This can affect whether you should start being screened again.  Some women have medical problems that increase their chance of getting cervical cancer. If this is the case for you, your health care  provider may recommend more frequent screening and Pap tests.  The human papillomavirus (HPV) test is another test that may be used for cervical cancer screening. The HPV test looks for the virus that can cause cell changes in the cervix. The cells collected during the Pap test can be tested for HPV.  The HPV test can be used to screen women 62 years of age and older. Getting tested for HPV can extend the interval between normal Pap tests from three to five years.  An HPV test also should be used to screen women of any age who have unclear Pap test results.  After 71 years of age, women should have HPV testing as often as Pap tests.  Colorectal Cancer  This type of cancer can be detected and often prevented.  Routine colorectal cancer screening usually begins at 71 years of age and continues through 71 years of age.  Your health care provider may recommend screening at an earlier age if you have risk factors for colon cancer.  Your health care provider may also recommend using home test kits to check for hidden blood in the stool.  A small camera at the end of a tube can be used to examine your colon directly (sigmoidoscopy or colonoscopy). This is done to check for the earliest forms of colorectal cancer.  Routine screening usually begins at age 56.  Direct examination of the colon should be repeated every 5-10 years through 71 years of age. However, you may need to be screened more often if early forms of precancerous polyps or small growths are found. Skin Cancer  Check your skin from head  to toe regularly.  Tell your health care provider about any new moles or changes in moles, especially if there is a change in a mole's shape or color.  Also tell your health care provider if you have a mole that is larger than the size of a pencil eraser.  Always use sunscreen. Apply sunscreen liberally and repeatedly throughout the day.  Protect yourself by wearing long sleeves, pants, a  wide-brimmed hat, and sunglasses whenever you are outside. HEART DISEASE, DIABETES, AND HIGH BLOOD PRESSURE   Have your blood pressure checked at least every 1-2 years. High blood pressure causes heart disease and increases the risk of stroke.  If you are between 53 years and 51 years old, ask your health care provider if you should take aspirin to prevent strokes.  Have regular diabetes screenings. This involves taking a blood sample to check your fasting blood sugar level.  If you are at a normal weight and have a low risk for diabetes, have this test once every three years after 71 years of age.  If you are overweight and have a high risk for diabetes, consider being tested at a younger age or more often. PREVENTING INFECTION  Hepatitis B  If you have a higher risk for hepatitis B, you should be screened for this virus. You are considered at high risk for hepatitis B if:  You were born in a country where hepatitis B is common. Ask your health care provider which countries are considered high risk.  Your parents were born in a high-risk country, and you have not been immunized against hepatitis B (hepatitis B vaccine).  You have HIV or AIDS.  You use needles to inject street drugs.  You live with someone who has hepatitis B.  You have had sex with someone who has hepatitis B.  You get hemodialysis treatment.  You take certain medicines for conditions, including cancer, organ transplantation, and autoimmune conditions. Hepatitis C  Blood testing is recommended for:  Everyone born from [birth year] through 1967.  Anyone with known risk factors for hepatitis C. Sexually transmitted infections (STIs)  You should be screened for sexually transmitted infections (STIs) including gonorrhea and chlamydia if:  You are sexually active and are younger than 71 years of age.  You are older than 71 years of age and your health care provider tells you that you are at risk for this type of  infection.  Your sexual activity has changed since you were last screened and you are at an increased risk for chlamydia or gonorrhea. Ask your health care provider if you are at risk.  If you do not have HIV, but are at risk, it may be recommended that you take a prescription medicine daily to prevent HIV infection. This is called pre-exposure prophylaxis (PrEP). You are considered at risk if:  You are sexually active and do not regularly use condoms or know the HIV status of your partner(s).  You take drugs by injection.  You are sexually active with a partner who has HIV. Talk with your health care provider about whether you are at high risk of being infected with HIV. If you choose to begin PrEP, you should first be tested for HIV. You should then be tested every 3 months for as long as you are taking PrEP.  PREGNANCY   If you are premenopausal and you may become pregnant, ask your health care provider about preconception counseling.  If you may become pregnant, take 400 to 800 micrograms (  mcg) of folic acid every day.  If you want to prevent pregnancy, talk to your health care provider about birth control (contraception). OSTEOPOROSIS AND MENOPAUSE   Osteoporosis is a disease in which the bones lose minerals and strength with aging. This can result in serious bone fractures. Your risk for osteoporosis can be identified using a bone density scan.  If you are 31 years of age or older, or if you are at risk for osteoporosis and fractures, ask your health care provider if you should be screened.  Ask your health care provider whether you should take a calcium or vitamin D supplement to lower your risk for osteoporosis.  Menopause may have certain physical symptoms and risks.  Hormone replacement therapy may reduce some of these symptoms and risks. Talk to your health care provider about whether hormone replacement therapy is right for you.  HOME CARE INSTRUCTIONS   Schedule regular  health, dental, and eye exams.  Stay current with your immunizations.   Do not use any tobacco products including cigarettes, chewing tobacco, or electronic cigarettes.  If you are pregnant, do not drink alcohol.  If you are breastfeeding, limit how much and how often you drink alcohol.  Limit alcohol intake to no more than 1 drink per day for nonpregnant women. One drink equals 12 ounces of beer, 5 ounces of wine, or 1 ounces of hard liquor.  Do not use street drugs.  Do not share needles.  Ask your health care provider for help if you need support or information about quitting drugs.  Tell your health care provider if you often feel depressed.  Tell your health care provider if you have ever been abused or do not feel safe at home. Document Released: 04/27/2011 Document Revised: 02/26/2014 Document Reviewed: 09/13/2013 [facility] Patient Information 2015 [place], [place]. This information is not intended to replace advice given to you by your health care provider. Make sure you discuss any questions you have with your health care provider.

## 2015-06-28 NOTE — Progress Notes (Addendum)
Subjective:   [name] [name] is a 69 y.o. female who presents for Medicare Annual/Subsequent preventive examination.  Review of Systems:   HRA assessment completed during visit;  Patient is here for Annual Wellness Assessment The Patient was informed that this wellness visit is to identify risk and educate on how to reduce risk for increase disease through lifestyle changes.   ROS deferred to CPE exam with physician  BMI: 29.3; Educated on weight and risk for DM and CVD Diet; Eats green leafy vegetables; eats a lot of fish; bake, fry and tuna salad; does not eat a lot of pork or beef Craves sweets at times but likes healthy items as well; Educated and given information on the Medittearean Diet Never had weight problem in the past until the past 3 years;  Needs to know how to count a calorie; Educated on using online tracking as Myfitnesspal or other to keep up with calories. Reviewed food level and focused on sodium; Fat; calories per serving and avoiding HFCS  Exercise; fairly sedentary now; will soon be getting a dog and she knows she will walk dog; Ginger herb has helped her pain as knee feels good at the present time; Coq10;  per day Did water aerobics in the past; may start this again;   Smoke: no smoking hx  Lives alone; went to [employer] [place] 2 weeks ago; did well; Looking forward to moving next year to be near sisters in [place] N/c and may move there; her sisters are a back up plan if you needs assistance with her health.   SAFETY  Gait: venous insufficiency; spinal stenosis and back pain/ pain is controlled medically Safety reviewed for the home; including removal of clutter; clear paths through the home, eliminating clutter, railing as needed; bathroom safety; community safety; smoke detectors and firearms safety as well as sun protection;  Driving accidents and seatbelt Sun protection Stressors; none noted  Medication review and would like to come of some BP med if  she can lose weight and exercise  Fall assessment; primary OA   Mobilization and Functional losses in the last year. no  Urinary or fecal incontinence reviewed; no issues  Counseling: Hepatitis C due to DOB 1947 -1065/ already spoke to Dr. Posey Rea and educated and will decline Hep C titer at this time Colonoscopy; Feb 2022/ normal last exam per the patient EKG 05/2011 Dexa scan; due 07/2009; Educated and will schedule to be done as she has never had a dexa scan; TBS prior to apt with [facility] M[facility] 11/13/2010 / due 2014/ have mammograms; Dr. Henderson Cloud; [facility]  [phone]) I will attempt to get last mammogram today The patient will call and fup for mammogram this year PAP every year;  Hearing:  Ophthalmology exam; Eye exam completed; had line across eye / left eye? hx of detached retina Referred to retinal; Dx floaters/ neg glaucoma  Immunizations Due  Shingles/ has checked with insurance and copay is high and she has not had this yet Flu: agrees to take today   Current Care Team reviewed and updated Ophthalmologist; Dr. Elmer Picker GYN Dr. Henderson Cloud  Cardiac Risk Factors include: advanced age (>86men, >69 women);hypertension;sedentary lifestyle     Objective:    Vitals: BP 122/70 mmHg  Ht  (1.702 m)  Wt 187 lb 4 oz (84.936 kg)  BMI 29.32 kg/m2  Tobacco History  Smoking status  . Never Smoker   Smokeless tobacco  . Never Used     Counseling given: Not  Answered   Past Medical History  Diagnosis Date  . SLE (systemic lupus erythematosus)     Per Dr Corliss Skains  . Fibromyalgia     ?  [name] History of breast cancer   . HTN (hypertension)   . Chronic headaches   . DJD (degenerative joint disease)   . Anemia, iron deficiency     remote  . Depression   . Hx of adenomatous colonic polyps 11/2004  . GERD (gastroesophageal reflux disease)   . Internal hemorrhoids   . LBP (low back pain)   . Spinal stenosis   . Esophageal stricture   . TMJ  (temporomandibular joint syndrome)    Past Surgical History  Procedure Laterality Date  . Appendectomy    . Breast lumpectomy      with a curative resection of malignancy  . Pituitary excision      via sphenoid approach  . Bunionectomy    . Eye surgery      Left  . Cervical spine surgery  Jun 15, 2011    Anterior cervical diskectomies and fusion at C4-5, C5-6  . Abdominal hysterectomy      suspect myomectomy for fibroids with intact cervix in '10   Family History  Problem Relation Age of Onset  . Hypertension Mother   . Arrhythmia Mother   . Gout Mother   . Thyroid disease Sister   . Diabetes Sister   . Hypertension Son   . Other Neg Hx     PITUITARY DISEASE  . Colon cancer Neg Hx   . Multiple sclerosis Daughter    History  Sexual Activity  . Sexual Activity: Yes  . Birth Control/ Protection: Post-menopausal    Outpatient Encounter Prescriptions as of 06/28/2015  Medication Sig  . acetaminophen (TYLENOL) 500 MG tablet Take by mouth as needed.  . Ascorbic Acid (VITAMIN C PO) Take 500 Units by mouth daily.   [name] aspirin 81 MG EC tablet Take 81 mg by mouth daily.    . B Complex Vitamins (B COMPLEX PO) Take by mouth daily.  . Cholecalciferol 1000 UNITS tablet Take 1,000 Units by mouth daily.    . Coenzyme Q10 (COQ-10) 100 MG CAPS Take 100 mg by mouth 1 day or 1 dose.  . estradiol (ESTRACE) 0.5 MG tablet Take 0.5 mg by mouth daily.  [name] losartan-hydrochlorothiazide (HYZAAR) 100-25 MG per tablet Take 1 tablet by mouth daily.  . methimazole (TAPAZOLE) 10 MG tablet Take 1 tablet (10 mg total) by mouth daily.  . metoprolol (LOPRESSOR) 50 MG tablet Take 2 tablets (100 mg total) by mouth 2 (two) times daily.  [name] morphine (MS CONTIN) 15 MG 12 hr tablet Take 1 tablet (15 mg total) by mouth 2 (two) times daily. May fill on or after 06/16/2015  . pantoprazole (PROTONIX) 40 MG tablet Take 1 tablet (40 mg total) by mouth daily.   No facility-administered encounter medications on file as of  06/28/2015.    Activities of Daily Living In your present state of health, do you have any difficulty performing the following activities: 06/28/2015  Hearing? N  Vision? N  Difficulty concentrating or making decisions? N  Walking or climbing stairs? N  Dressing or bathing? N  Doing errands, shopping? N  Preparing Food and eating ? N  Using the Toilet? N  In the past six months, have you accidently leaked urine? N  Do you have problems with loss of bowel control? N  Managing your Medications? N  Managing your Finances?  N  Housekeeping or managing your Housekeeping? N    Patient Care Team: Tresa Garter, MD as PCP - General (Internal Medicine) Sanjuana Letters, MD as Referring Physician (Orthopedic Surgery) Carrington Clamp, MD as Consulting Physician (Obstetrics and Gynecology) Mateo Flow, MD as Consulting Physician (Ophthalmology)   Assessment:     Assessment   Today patient counseled on age appropriate routine health concerns for screening and prevention, will have dexa completed;  Immunizations reviewed; Shingles is to expensive but she is considering; This was under Part d; will take the high does flu shot today. .Labs deferred for CPE or medical fup by MD.  Risk factors for depression reviewed and negative. Hearing function and visual acuity are intact. ADLs screened and addressed as needed. Functional ability and level of safety reviewed and appropriate. Educated on memory loss and AD8 score was 0. No issues identified; affect good; a and o;  Education, counseling and referrals performed based on assessed risks today. Patient provided with a copy of personalized plan for preventive services and due dates    BP Education: Goal <140/80; any elevation will warrant further checks Weight loss goal approx 165; (12lbs)   EXERCISE / DIET RECOMMENDATIONS The patient agrees to start walking, start walking; may do pool exercise and track food intake; avoid HFCS;   Barriers to  successful management: None noted  Stress; (1-5) Risk and reduction techniques reviewed   Exercise Activities and Dietary recommendations Current Exercise Habits:: Home exercise routine, Intensity: Mild  Goals    . Weight < 175 lb (79.379 kg)     [facility] and walking as tolerated Water exercise may be useful  Weight: Tracking; portion plate; monitoring fat intake on the label; drinking water; Watch for foods with High fructose corn syrup  Exercise;  (recommended 30 minutes; 5 days a week) BMI reviewed and educated Educated regarding online nutrition programs as WikiBlast.com.cy and LimitLaws.com.cy; fit32me;           Fall Risk Fall Risk  06/28/2015  Falls in the past year? No   Depression Screen PHQ 2/9 Scores 06/28/2015  PHQ - 2 Score 0    Cognitive Testing MMSE - Mini Mental State Exam 06/28/2015  Not completed: (No Data)    Immunization History  Administered Date(s) Administered  . Influenza Split 09/27/2012  . Influenza, High Dose Seasonal PF 08/24/2013, 06/28/2015  . Influenza,inj,Quad PF,36+ Mos 08/09/2014  . Pneumococcal Conjugate-13 10/11/2013  . Pneumococcal Polysaccharide-23 06/16/2011   Screening Tests Health Maintenance  Topic Date Due  . Hepatitis C Screening  04-Dec-1945  . ZOSTAVAX  08/19/2004  . DEXA SCAN  08/19/2009  . MAMMOGRAM  11/12/2012  . INFLUENZA VACCINE  05/27/2015  . COLONOSCOPY  12/23/2020  . TETANUS/TDAP  05/05/2023  . PNA vac Low Risk Adult  Completed      Plan:     Rec'd high does flu shot today. Call to Dr. Henderson Cloud and office will fax over mammogram last year;  The patient to call the office to schedule this year. Discussed Dexa scan and agreed to have screen completed; Scheduled apt today  Set goal to lose weight;   During the course of the visit the patient was educated and counseled about the following appropriate screening and preventive services:   Vaccines to include  Pneumoccal, Influenza, Hepatitis B, Td, Zostavax, HCV  Electrocardiogram 05/2011 and no CVD issues  Cardiovascular Disease/ neg  Colorectal cancer screening/ due 2022  Diabetes screening/ states  was checked and was normal (98)  Educated on pre-diabetes and risk  Glaucoma screening/ neg  Nutrition counseling / discussed and recommended tracking; monitor labels for HFCS and fat and calories; given info on mediterranean diet  Smoking cessation counseling/ no hx  Patient Instructions (the written plan) was given to the patient.    Davi Kroon, RN  06/28/2015   Medical screening examination/treatment/procedure(s) were performed by non-physician practitioner and as supervising physician I was immediately available for consultation/collaboration. I agree with above. Sonda Primes, MD

## 2015-07-05 ENCOUNTER — Ambulatory Visit (INDEPENDENT_AMBULATORY_CARE_PROVIDER_SITE_OTHER): Payer: PPO | Admitting: Internal Medicine

## 2015-07-05 ENCOUNTER — Encounter: Payer: Self-pay | Admitting: Internal Medicine

## 2015-07-05 VITALS — BP 136/86 | HR 67 | Ht 67.0 in | Wt 185.0 lb

## 2015-07-05 DIAGNOSIS — I10 Essential (primary) hypertension: Secondary | ICD-10-CM

## 2015-07-05 DIAGNOSIS — R202 Paresthesia of skin: Secondary | ICD-10-CM

## 2015-07-05 DIAGNOSIS — M5440 Lumbago with sciatica, unspecified side: Secondary | ICD-10-CM

## 2015-07-05 DIAGNOSIS — Z0000 Encounter for general adult medical examination without abnormal findings: Secondary | ICD-10-CM

## 2015-07-05 DIAGNOSIS — G4710 Hypersomnia, unspecified: Secondary | ICD-10-CM

## 2015-07-05 DIAGNOSIS — G4700 Insomnia, unspecified: Secondary | ICD-10-CM

## 2015-07-05 MED ORDER — MORPHINE SULFATE ER 15 MG PO TBCR: 15.0000 mg | EXTENDED_RELEASE_TABLET | Freq: Two times a day (BID) | ORAL | Status: DC

## 2015-07-05 NOTE — Assessment & Plan Note (Addendum)
Here for medicare wellness/physical  Diet: heart healthy  Physical activity: not sedentary  Depression/mood screen: negative  Hearing: intact to whispered voice  Visual acuity: grossly normal, performs annual eye exam  ADLs: capable  Fall risk: low to none  Home safety: good  Cognitive evaluation: intact to orientation, naming, recall and repetition  EOL planning: adv directives, full code/ I agree  I have personally reviewed and have noted  1. The patient's medical, surgical and social history  2. Their use of alcohol, tobacco or illicit drugs  3. Their current medications and supplements  4. The patient's functional ability including ADL's, fall risks, home safety risks and hearing or visual impairment.  5. Diet and physical activities  6. Evidence for depression or mood disorders 7. The roster of all physicians providing medical care to patient - is listed in the Snapshot section of the chart and reviewed today.    Today patient counseled on age appropriate routine health concerns for screening and prevention, each reviewed and up to date or declined. Immunizations reviewed and up to date or declined. Labs ordered and reviewed. Risk factors for depression reviewed and negative. Hearing function and visual acuity are intact. ADLs screened and addressed as needed. Functional ability and level of safety reviewed and appropriate. Education, counseling and referrals performed based on assessed risks today. Patient provided with a copy of personalized plan for preventive services.   DEXA, mammo Colon up to date

## 2015-07-05 NOTE — Progress Notes (Signed)
Pre visit review using our clinic review tool, if applicable. No additional management support is needed unless otherwise documented below in the visit note. 

## 2015-07-05 NOTE — Progress Notes (Signed)
Subjective:  Patient ID: [name] [name], female    DOB: 12-Feb-1946  Age: 69 y.o. MRN: [id number]  CC: No chief complaint on file.   HPI [name] [name] presents for a well exam. F/u LBP, HTN, GERD  Outpatient Prescriptions Prior to Visit  Medication Sig Dispense Refill  . acetaminophen (TYLENOL) 500 MG tablet Take by mouth as needed.    . Ascorbic Acid (VITAMIN C PO) Take 500 Units by mouth daily.     [name] aspirin 81 MG EC tablet Take 81 mg by mouth daily.      . B Complex Vitamins (B COMPLEX PO) Take by mouth daily.    . Cholecalciferol 1000 UNITS tablet Take 1,000 Units by mouth daily.      . Coenzyme Q10 (COQ-10) 100 MG CAPS Take 100 mg by mouth 1 day or 1 dose.    . estradiol (ESTRACE) 0.5 MG tablet Take 0.5 mg by mouth daily.    [name] losartan-hydrochlorothiazide (HYZAAR) 100-25 MG per tablet Take 1 tablet by mouth daily. 90 tablet 3  . methimazole (TAPAZOLE) 10 MG tablet Take 1 tablet (10 mg total) by mouth daily. 90 tablet 3  . metoprolol (LOPRESSOR) 50 MG tablet Take 2 tablets (100 mg total) by mouth 2 (two) times daily. 180 tablet 3  . morphine (MS CONTIN) 15 MG 12 hr tablet Take 1 tablet (15 mg total) by mouth 2 (two) times daily. May fill on or after 06/16/2015 60 tablet 0  . pantoprazole (PROTONIX) 40 MG tablet Take 1 tablet (40 mg total) by mouth daily. 90 tablet 3   No facility-administered medications prior to visit.    ROS Review of Systems  Objective:  BP 136/86 mmHg  Pulse 67  Ht 5\' 7"  (1.702 m)  Wt 185 lb (83.915 kg)  BMI 28.97 kg/m2  SpO2 97%  BP Readings from Last 3 Encounters:  07/05/15 136/86  06/28/15 122/70  03/28/15 118/70    Wt Readings from Last 3 Encounters:  07/05/15 185 lb (83.915 kg)  06/28/15 187 lb 4 oz (84.936 kg)  03/28/15 186 lb (84.369 kg)    Physical Exam  Lab Results  Component Value Date   WBC 7.3 03/28/2015   HGB 12.8 03/28/2015   HCT 38.1 03/28/2015   PLT 252.0 03/28/2015   GLUCOSE 98 03/28/2015   CHOL 158 02/19/2014   TRIG 108.0 02/19/2014   HDL 67.70 02/19/2014   LDLCALC 69 02/19/2014   ALT 11 03/28/2015   AST 18 03/28/2015   NA 137 03/28/2015   K 4.3 03/28/2015   CL 100 03/28/2015   CREATININE 1.25* 03/28/2015   BUN 20 03/28/2015   CO2 26 03/28/2015   TSH 1.16 03/28/2015    Dexascan  07/02/2015   Date of study: 06/28/2015 Exam: DUAL X-RAY ABSORPTIOMETRY (DXA) FOR BONE MINERAL DENSITY (BMD) Instrument: [employer] Therapist, art Provider: PCP Indication: screening for osteoporosis Comparison: none (please note that it is not possible to compare data from  different instruments) Clinical data: Pt is a postmenopausal 69 y.o. female without previous  nontraumatic fractures. On estrogen and vitamin D  Results:  Lumbar spine L1-L3 (L4) Femoral neck (FN)  T-score  +2.0  RFN: -0.9  LFN: -1.0    Assessment: the BMD is normal according to the [facility] classification for  osteoporosis (see below).  Fracture risk: low FRAX score: not calculated due to normal BMD Comments: the technical quality of the study is good, however, L4 vertebra  had to be excluded from analysis.  Recommend  optimizing calcium (1200 mg/day) and vitamin D (800 IU/day)  intake.  Followup: Repeat BMD is appropriate after 2 years.  WHO criteria for diagnosis of osteoporosis in postmenopausal women and in  men 4 y/o or older:  - normal: T-score -1.0 to + 1.0 - osteopenia/low bone density: T-score between -2.5 and -1.0 - osteoporosis: T-score below -2.5 - severe osteoporosis: T-score below -2.5 with history of fragility  fracture Note: although not part of the WHO classification, the presence of a  fragility fracture, regardless of the T-score, should be considered  diagnostic of osteoporosis, provided other causes for the fracture have  been excluded.  Carlus Pavlov, MD [facility]     Assessment & Plan:   Diagnoses and all orders for this visit:  Essential hypertension -     morphine (MS CONTIN) 15 MG 12 hr tablet; Take 1 tablet (15 mg  total) by mouth 2 (two) times daily. May fill on or after 06/16/2015  INSOMNIA, CHRONIC -     morphine (MS CONTIN) 15 MG 12 hr tablet; Take 1 tablet (15 mg total) by mouth 2 (two) times daily. May fill on or after 06/16/2015  Hypersomnia -     morphine (MS CONTIN) 15 MG 12 hr tablet; Take 1 tablet (15 mg total) by mouth 2 (two) times daily. May fill on or after 06/16/2015  Midline low back pain with sciatica, sciatica laterality unspecified -     morphine (MS CONTIN) 15 MG 12 hr tablet; Take 1 tablet (15 mg total) by mouth 2 (two) times daily. May fill on or after 06/16/2015  Paresthesia -     morphine (MS CONTIN) 15 MG 12 hr tablet; Take 1 tablet (15 mg total) by mouth 2 (two) times daily. May fill on or after 06/16/2015   I am having [name] [name] maintain her aspirin, Cholecalciferol, Ascorbic Acid (VITAMIN C PO), B Complex Vitamins (B COMPLEX PO), pantoprazole, metoprolol, methimazole, losartan-hydrochlorothiazide, estradiol, acetaminophen, morphine, and CoQ-10.  No orders of the defined types were placed in this encounter.     Follow-up: No Follow-up on file.  Sonda Primes, MD

## 2015-07-05 NOTE — Patient Instructions (Signed)
Preventive Care for Adults A healthy lifestyle and preventive care can promote health and wellness. Preventive health guidelines for women include the following key practices.  A routine yearly physical is a good way to check with your health care provider about your health and preventive screening. It is a chance to share any concerns and updates on your health and to receive a thorough exam.  Visit your dentist for a routine exam and preventive care every 6 months. Brush your teeth twice a day and floss once a day. Good oral hygiene prevents tooth decay and gum disease.  The frequency of eye exams is based on your age, health, family medical history, use of contact lenses, and other factors. Follow your health care provider's recommendations for frequency of eye exams.  Eat a healthy diet. Foods like vegetables, fruits, whole grains, low-fat dairy products, and lean protein foods contain the nutrients you need without too many calories. Decrease your intake of foods high in solid fats, added sugars, and salt. Eat the right amount of calories for you.Get information about a proper diet from your health care provider, if necessary.  Regular physical exercise is one of the most important things you can do for your health. Most adults should get at least 150 minutes of moderate-intensity exercise (any activity that increases your heart rate and causes you to sweat) each week. In addition, most adults need muscle-strengthening exercises on 2 or more days a week.  Maintain a healthy weight. The body mass index (BMI) is a screening tool to identify possible weight problems. It provides an estimate of body fat based on height and weight. Your health care provider can find your BMI and can help you achieve or maintain a healthy weight.For adults 20 years and older:  A BMI below 18.5 is considered underweight.  A BMI of 18.5 to 24.9 is normal.  A BMI of 25 to 29.9 is considered overweight.  A BMI of  30 and above is considered obese.  Maintain normal blood lipids and cholesterol levels by exercising and minimizing your intake of saturated fat. Eat a balanced diet with plenty of fruit and vegetables. Blood tests for lipids and cholesterol should begin at age 76 and be repeated every 5 years. If your lipid or cholesterol levels are high, you are over 50, or you are at high risk for heart disease, you may need your cholesterol levels checked more frequently.Ongoing high lipid and cholesterol levels should be treated with medicines if diet and exercise are not working.  If you smoke, find out from your health care provider how to quit. If you do not use tobacco, do not start.  Lung cancer screening is recommended for adults aged 22-80 years who are at high risk for developing lung cancer because of a history of smoking. A yearly low-dose CT scan of the lungs is recommended for people who have at least a 30-pack-year history of smoking and are a current smoker or have quit within the past 15 years. A pack year of smoking is smoking an average of 1 pack of cigarettes a day for 1 year (for example: 1 pack a day for 30 years or 2 packs a day for 15 years). Yearly screening should continue until the smoker has stopped smoking for at least 15 years. Yearly screening should be stopped for people who develop a health problem that would prevent them from having lung cancer treatment.  If you are pregnant, do not drink alcohol. If you are breastfeeding,  be very cautious about drinking alcohol. If you are not pregnant and choose to drink alcohol, do not have more than 1 drink per day. One drink is considered to be 12 ounces (355 mL) of beer, 5 ounces (148 mL) of wine, or 1.5 ounces (44 mL) of liquor.  Avoid use of street drugs. Do not share needles with anyone. Ask for help if you need support or instructions about stopping the use of drugs.  High blood pressure causes heart disease and increases the risk of  stroke. Your blood pressure should be checked at least every 1 to 2 years. Ongoing high blood pressure should be treated with medicines if weight loss and exercise do not work.  If you are 3-86 years old, ask your health care provider if you should take aspirin to prevent strokes.  Diabetes screening involves taking a blood sample to check your fasting blood sugar level. This should be done once every 3 years, after age 67, if you are within normal weight and without risk factors for diabetes. Testing should be considered at a younger age or be carried out more frequently if you are overweight and have at least 1 risk factor for diabetes.  Breast cancer screening is essential preventive care for women. You should practice "breast self-awareness." This means understanding the normal appearance and feel of your breasts and may include breast self-examination. Any changes detected, no matter how small, should be reported to a health care provider. Women in their 8s and 30s should have a clinical breast exam (CBE) by a health care provider as part of a regular health exam every 1 to 3 years. After age 70, women should have a CBE every year. Starting at age 25, women should consider having a mammogram (breast X-ray test) every year. Women who have a family history of breast cancer should talk to their health care provider about genetic screening. Women at a high risk of breast cancer should talk to their health care providers about having an MRI and a mammogram every year.  Breast cancer gene (BRCA)-related cancer risk assessment is recommended for women who have family members with BRCA-related cancers. BRCA-related cancers include breast, ovarian, tubal, and peritoneal cancers. Having family members with these cancers may be associated with an increased risk for harmful changes (mutations) in the breast cancer genes BRCA1 and BRCA2. Results of the assessment will determine the need for genetic counseling and  BRCA1 and BRCA2 testing.  Routine pelvic exams to screen for cancer are no longer recommended for nonpregnant women who are considered low risk for cancer of the pelvic organs (ovaries, uterus, and vagina) and who do not have symptoms. Ask your health care provider if a screening pelvic exam is right for you.  If you have had past treatment for cervical cancer or a condition that could lead to cancer, you need Pap tests and screening for cancer for at least 20 years after your treatment. If Pap tests have been discontinued, your risk factors (such as having a new sexual partner) need to be reassessed to determine if screening should be resumed. Some women have medical problems that increase the chance of getting cervical cancer. In these cases, your health care provider may recommend more frequent screening and Pap tests.  The HPV test is an additional test that may be used for cervical cancer screening. The HPV test looks for the virus that can cause the cell changes on the cervix. The cells collected during the Pap test can be  tested for HPV. The HPV test could be used to screen women aged 30 years and older, and should be used in women of any age who have unclear Pap test results. After the age of 30, women should have HPV testing at the same frequency as a Pap test.  Colorectal cancer can be detected and often prevented. Most routine colorectal cancer screening begins at the age of 50 years and continues through age 75 years. However, your health care provider may recommend screening at an earlier age if you have risk factors for colon cancer. On a yearly basis, your health care provider may provide home test kits to check for hidden blood in the stool. Use of a small camera at the end of a tube, to directly examine the colon (sigmoidoscopy or colonoscopy), can detect the earliest forms of colorectal cancer. Talk to your health care provider about this at age 50, when routine screening begins. Direct  exam of the colon should be repeated every 5-10 years through age 75 years, unless early forms of pre-cancerous polyps or small growths are found.  People who are at an increased risk for hepatitis B should be screened for this virus. You are considered at high risk for hepatitis B if:  You were born in a country where hepatitis B occurs often. Talk with your health care provider about which countries are considered high risk.  Your parents were born in a high-risk country and you have not received a shot to protect against hepatitis B (hepatitis B vaccine).  You have HIV or AIDS.  You use needles to inject street drugs.  You live with, or have sex with, someone who has hepatitis B.  You get hemodialysis treatment.  You take certain medicines for conditions like cancer, organ transplantation, and autoimmune conditions.  Hepatitis C blood testing is recommended for all people born from 1945 through 1965 and any individual with known risks for hepatitis C.  Practice safe sex. Use condoms and avoid high-risk sexual practices to reduce the spread of sexually transmitted infections (STIs). STIs include gonorrhea, chlamydia, syphilis, trichomonas, herpes, HPV, and human immunodeficiency virus (HIV). Herpes, HIV, and HPV are viral illnesses that have no cure. They can result in disability, cancer, and death.  You should be screened for sexually transmitted illnesses (STIs) including gonorrhea and chlamydia if:  You are sexually active and are younger than 24 years.  You are older than 24 years and your health care provider tells you that you are at risk for this type of infection.  Your sexual activity has changed since you were last screened and you are at an increased risk for chlamydia or gonorrhea. Ask your health care provider if you are at risk.  If you are at risk of being infected with HIV, it is recommended that you take a prescription medicine daily to prevent HIV infection. This is  called preexposure prophylaxis (PrEP). You are considered at risk if:  You are a heterosexual woman, are sexually active, and are at increased risk for HIV infection.  You take drugs by injection.  You are sexually active with a partner who has HIV.  Talk with your health care provider about whether you are at high risk of being infected with HIV. If you choose to begin PrEP, you should first be tested for HIV. You should then be tested every 3 months for as long as you are taking PrEP.  Osteoporosis is a disease in which the bones lose minerals and strength   with aging. This can result in serious bone fractures or breaks. The risk of osteoporosis can be identified using a bone density scan. Women ages 65 years and over and women at risk for fractures or osteoporosis should discuss screening with their health care providers. Ask your health care provider whether you should take a calcium supplement or vitamin D to reduce the rate of osteoporosis.  Menopause can be associated with physical symptoms and risks. Hormone replacement therapy is available to decrease symptoms and risks. You should talk to your health care provider about whether hormone replacement therapy is right for you.  Use sunscreen. Apply sunscreen liberally and repeatedly throughout the day. You should seek shade when your shadow is shorter than you. Protect yourself by wearing long sleeves, pants, a wide-brimmed hat, and sunglasses year round, whenever you are outdoors.  Once a month, do a whole body skin exam, using a mirror to look at the skin on your back. Tell your health care provider of new moles, moles that have irregular borders, moles that are larger than a pencil eraser, or moles that have changed in shape or color.  Stay current with required vaccines (immunizations).  Influenza vaccine. All adults should be immunized every year.  Tetanus, diphtheria, and acellular pertussis (Td, Tdap) vaccine. Pregnant women should  receive 1 dose of Tdap vaccine during each pregnancy. The dose should be obtained regardless of the length of time since the last dose. Immunization is preferred during the 27th-36th week of gestation. An adult who has not previously received Tdap or who does not know her vaccine status should receive 1 dose of Tdap. This initial dose should be followed by tetanus and diphtheria toxoids (Td) booster doses every 10 years. Adults with an unknown or incomplete history of completing a 3-dose immunization series with Td-containing vaccines should begin or complete a primary immunization series including a Tdap dose. Adults should receive a Td booster every 10 years.  Varicella vaccine. An adult without evidence of immunity to varicella should receive 2 doses or a second dose if she has previously received 1 dose. Pregnant females who do not have evidence of immunity should receive the first dose after pregnancy. This first dose should be obtained before leaving the health care facility. The second dose should be obtained 4-8 weeks after the first dose.  Human papillomavirus (HPV) vaccine. Females aged 13-26 years who have not received the vaccine previously should obtain the 3-dose series. The vaccine is not recommended for use in pregnant females. However, pregnancy testing is not needed before receiving a dose. If a female is found to be pregnant after receiving a dose, no treatment is needed. In that case, the remaining doses should be delayed until after the pregnancy. Immunization is recommended for any person with an immunocompromised condition through the age of 26 years if she did not get any or all doses earlier. During the 3-dose series, the second dose should be obtained 4-8 weeks after the first dose. The third dose should be obtained 24 weeks after the first dose and 16 weeks after the second dose.  Zoster vaccine. One dose is recommended for adults aged 60 years or older unless certain conditions are  present.  Measles, mumps, and rubella (MMR) vaccine. Adults born before 1957 generally are considered immune to measles and mumps. Adults born in 1957 or later should have 1 or more doses of MMR vaccine unless there is a contraindication to the vaccine or there is laboratory evidence of immunity to   each of the three diseases. A routine second dose of MMR vaccine should be obtained at least 28 days after the first dose for students attending postsecondary schools, health care workers, or international travelers. People who received inactivated measles vaccine or an unknown type of measles vaccine during 1963-1967 should receive 2 doses of MMR vaccine. People who received inactivated mumps vaccine or an unknown type of mumps vaccine before 1979 and are at high risk for mumps infection should consider immunization with 2 doses of MMR vaccine. For females of childbearing age, rubella immunity should be determined. If there is no evidence of immunity, females who are not pregnant should be vaccinated. If there is no evidence of immunity, females who are pregnant should delay immunization until after pregnancy. Unvaccinated health care workers born before 1957 who lack laboratory evidence of measles, mumps, or rubella immunity or laboratory confirmation of disease should consider measles and mumps immunization with 2 doses of MMR vaccine or rubella immunization with 1 dose of MMR vaccine.  Pneumococcal 13-valent conjugate (PCV13) vaccine. When indicated, a person who is uncertain of her immunization history and has no record of immunization should receive the PCV13 vaccine. An adult aged 19 years or older who has certain medical conditions and has not been previously immunized should receive 1 dose of PCV13 vaccine. This PCV13 should be followed with a dose of pneumococcal polysaccharide (PPSV23) vaccine. The PPSV23 vaccine dose should be obtained at least 8 weeks after the dose of PCV13 vaccine. An adult aged 19  years or older who has certain medical conditions and previously received 1 or more doses of PPSV23 vaccine should receive 1 dose of PCV13. The PCV13 vaccine dose should be obtained 1 or more years after the last PPSV23 vaccine dose.  Pneumococcal polysaccharide (PPSV23) vaccine. When PCV13 is also indicated, PCV13 should be obtained first. All adults aged 65 years and older should be immunized. An adult younger than age 65 years who has certain medical conditions should be immunized. Any person who resides in a nursing home or long-term care facility should be immunized. An adult smoker should be immunized. People with an immunocompromised condition and certain other conditions should receive both PCV13 and PPSV23 vaccines. People with human immunodeficiency virus (HIV) infection should be immunized as soon as possible after diagnosis. Immunization during chemotherapy or radiation therapy should be avoided. Routine use of PPSV23 vaccine is not recommended for American Indians, [place] Natives, or people younger than 65 years unless there are medical conditions that require PPSV23 vaccine. When indicated, people who have unknown immunization and have no record of immunization should receive PPSV23 vaccine. One-time revaccination 5 years after the first dose of PPSV23 is recommended for people aged 19-64 years who have chronic kidney failure, nephrotic syndrome, asplenia, or immunocompromised conditions. People who received 1-2 doses of PPSV23 before age 65 years should receive another dose of PPSV23 vaccine at age 65 years or later if at least 5 years have passed since the previous dose. Doses of PPSV23 are not needed for people immunized with PPSV23 at or after age 65 years.  Meningococcal vaccine. Adults with asplenia or persistent complement component deficiencies should receive 2 doses of quadrivalent meningococcal conjugate (MenACWY-D) vaccine. The doses should be obtained at least 2 months apart.  Microbiologists working with certain meningococcal bacteria, military recruits, people at risk during an outbreak, and people who travel to or live in countries with a high rate of meningitis should be immunized. A first-year college student up through age   21 years who is living in a residence hall should receive a dose if she did not receive a dose on or after her 16th birthday. Adults who have certain high-risk conditions should receive one or more doses of vaccine.  Hepatitis A vaccine. Adults who wish to be protected from this disease, have certain high-risk conditions, work with hepatitis A-infected animals, work in hepatitis A research labs, or travel to or work in countries with a high rate of hepatitis A should be immunized. Adults who were previously unvaccinated and who anticipate close contact with an international adoptee during the first 60 days after arrival in the [place] from a country with a high rate of hepatitis A should be immunized.  Hepatitis B vaccine. Adults who wish to be protected from this disease, have certain high-risk conditions, may be exposed to blood or other infectious body fluids, are household contacts or sex partners of hepatitis B positive people, are clients or workers in certain care facilities, or travel to or work in countries with a high rate of hepatitis B should be immunized.  Haemophilus influenzae type b (Hib) vaccine. A previously unvaccinated person with asplenia or sickle cell disease or having a scheduled splenectomy should receive 1 dose of Hib vaccine. Regardless of previous immunization, a recipient of a hematopoietic stem cell transplant should receive a 3-dose series 6-12 months after her successful transplant. Hib vaccine is not recommended for adults with HIV infection. Preventive Services / Frequency Ages 64 to 68 years  Blood pressure check.** / Every 1 to 2 years.  Lipid and cholesterol check.** / Every 5 years beginning at age  22.  Clinical breast exam.** / Every 3 years for women in their 88s and 53s.  BRCA-related cancer risk assessment.** / For women who have family members with a BRCA-related cancer (breast, ovarian, tubal, or peritoneal cancers).  Pap test.** / Every 2 years from ages 90 through 51. Every 3 years starting at age 21 through age 56 or 3 with a history of 3 consecutive normal Pap tests.  HPV screening.** / Every 3 years from ages 24 through ages 1 to 46 with a history of 3 consecutive normal Pap tests.  Hepatitis C blood test.** / For any individual with known risks for hepatitis C.  Skin self-exam. / Monthly.  Influenza vaccine. / Every year.  Tetanus, diphtheria, and acellular pertussis (Tdap, Td) vaccine.** / Consult your health care provider. Pregnant women should receive 1 dose of Tdap vaccine during each pregnancy. 1 dose of Td every 10 years.  Varicella vaccine.** / Consult your health care provider. Pregnant females who do not have evidence of immunity should receive the first dose after pregnancy.  HPV vaccine. / 3 doses over 6 months, if 72 and younger. The vaccine is not recommended for use in pregnant females. However, pregnancy testing is not needed before receiving a dose.  Measles, mumps, rubella (MMR) vaccine.** / You need at least 1 dose of MMR if you were born in 1957 or later. You may also need a 2nd dose. For females of childbearing age, rubella immunity should be determined. If there is no evidence of immunity, females who are not pregnant should be vaccinated. If there is no evidence of immunity, females who are pregnant should delay immunization until after pregnancy.  Pneumococcal 13-valent conjugate (PCV13) vaccine.** / Consult your health care provider.  Pneumococcal polysaccharide (PPSV23) vaccine.** / 1 to 2 doses if you smoke cigarettes or if you have certain conditions.  Meningococcal vaccine.** /  1 dose if you are age 19 to 21 years and a first-year college  student living in a residence hall, or have one of several medical conditions, you need to get vaccinated against meningococcal disease. You may also need additional booster doses.  Hepatitis A vaccine.** / Consult your health care provider.  Hepatitis B vaccine.** / Consult your health care provider.  Haemophilus influenzae type b (Hib) vaccine.** / Consult your health care provider. Ages 40 to 64 years  Blood pressure check.** / Every 1 to 2 years.  Lipid and cholesterol check.** / Every 5 years beginning at age 20 years.  Lung cancer screening. / Every year if you are aged 55-80 years and have a 30-pack-year history of smoking and currently smoke or have quit within the past 15 years. Yearly screening is stopped once you have quit smoking for at least 15 years or develop a health problem that would prevent you from having lung cancer treatment.  Clinical breast exam.** / Every year after age 40 years.  BRCA-related cancer risk assessment.** / For women who have family members with a BRCA-related cancer (breast, ovarian, tubal, or peritoneal cancers).  Mammogram.** / Every year beginning at age 40 years and continuing for as long as you are in good health. Consult with your health care provider.  Pap test.** / Every 3 years starting at age 30 years through age 65 or 70 years with a history of 3 consecutive normal Pap tests.  HPV screening.** / Every 3 years from ages 30 years through ages 65 to 70 years with a history of 3 consecutive normal Pap tests.  Fecal occult blood test (FOBT) of stool. / Every year beginning at age 50 years and continuing until age 75 years. You may not need to do this test if you get a colonoscopy every 10 years.  Flexible sigmoidoscopy or colonoscopy.** / Every 5 years for a flexible sigmoidoscopy or every 10 years for a colonoscopy beginning at age 50 years and continuing until age 75 years.  Hepatitis C blood test.** / For all people born from 1945 through  1965 and any individual with known risks for hepatitis C.  Skin self-exam. / Monthly.  Influenza vaccine. / Every year.  Tetanus, diphtheria, and acellular pertussis (Tdap/Td) vaccine.** / Consult your health care provider. Pregnant women should receive 1 dose of Tdap vaccine during each pregnancy. 1 dose of Td every 10 years.  Varicella vaccine.** / Consult your health care provider. Pregnant females who do not have evidence of immunity should receive the first dose after pregnancy.  Zoster vaccine.** / 1 dose for adults aged 60 years or older.  Measles, mumps, rubella (MMR) vaccine.** / You need at least 1 dose of MMR if you were born in 1957 or later. You may also need a 2nd dose. For females of childbearing age, rubella immunity should be determined. If there is no evidence of immunity, females who are not pregnant should be vaccinated. If there is no evidence of immunity, females who are pregnant should delay immunization until after pregnancy.  Pneumococcal 13-valent conjugate (PCV13) vaccine.** / Consult your health care provider.  Pneumococcal polysaccharide (PPSV23) vaccine.** / 1 to 2 doses if you smoke cigarettes or if you have certain conditions.  Meningococcal vaccine.** / Consult your health care provider.  Hepatitis A vaccine.** / Consult your health care provider.  Hepatitis B vaccine.** / Consult your health care provider.  Haemophilus influenzae type b (Hib) vaccine.** / Consult your health care provider. Ages 65   years and over  Blood pressure check.** / Every 1 to 2 years.  Lipid and cholesterol check.** / Every 5 years beginning at age 22 years.  Lung cancer screening. / Every year if you are aged 73-80 years and have a 30-pack-year history of smoking and currently smoke or have quit within the past 15 years. Yearly screening is stopped once you have quit smoking for at least 15 years or develop a health problem that would prevent you from having lung cancer  treatment.  Clinical breast exam.** / Every year after age 4 years.  BRCA-related cancer risk assessment.** / For women who have family members with a BRCA-related cancer (breast, ovarian, tubal, or peritoneal cancers).  Mammogram.** / Every year beginning at age 40 years and continuing for as long as you are in good health. Consult with your health care provider.  Pap test.** / Every 3 years starting at age 9 years through age 34 or 91 years with 3 consecutive normal Pap tests. Testing can be stopped between 65 and 70 years with 3 consecutive normal Pap tests and no abnormal Pap or HPV tests in the past 10 years.  HPV screening.** / Every 3 years from ages 57 years through ages 64 or 45 years with a history of 3 consecutive normal Pap tests. Testing can be stopped between 65 and 70 years with 3 consecutive normal Pap tests and no abnormal Pap or HPV tests in the past 10 years.  Fecal occult blood test (FOBT) of stool. / Every year beginning at age 15 years and continuing until age 17 years. You may not need to do this test if you get a colonoscopy every 10 years.  Flexible sigmoidoscopy or colonoscopy.** / Every 5 years for a flexible sigmoidoscopy or every 10 years for a colonoscopy beginning at age 86 years and continuing until age 71 years.  Hepatitis C blood test.** / For all people born from [birth year] through 1967 and any individual with known risks for hepatitis C.  Osteoporosis screening.** / A one-time screening for women ages 83 years and over and women at risk for fractures or osteoporosis.  Skin self-exam. / Monthly.  Influenza vaccine. / Every year.  Tetanus, diphtheria, and acellular pertussis (Tdap/Td) vaccine.** / 1 dose of Td every 10 years.  Varicella vaccine.** / Consult your health care provider.  Zoster vaccine.** / 1 dose for adults aged 61 years or older.  Pneumococcal 13-valent conjugate (PCV13) vaccine.** / Consult your health care provider.  Pneumococcal  polysaccharide (PPSV23) vaccine.** / 1 dose for all adults aged 28 years and older.  Meningococcal vaccine.** / Consult your health care provider.  Hepatitis A vaccine.** / Consult your health care provider.  Hepatitis B vaccine.** / Consult your health care provider.  Haemophilus influenzae type b (Hib) vaccine.** / Consult your health care provider. ** Family history and personal history of risk and conditions may change your health care provider's recommendations. Document Released: 12/08/2001 Document Revised: 02/26/2014 Document Reviewed: 03/09/2011 [facility] Patient Information 2015 [place], [place]. This information is not intended to replace advice given to you by your health care provider. Make sure you discuss any questions you have with your health care provider.

## 2015-07-12 ENCOUNTER — Encounter: Payer: Self-pay | Admitting: Internal Medicine

## 2015-08-29 ENCOUNTER — Telehealth: Payer: Self-pay | Admitting: Internal Medicine

## 2015-08-29 NOTE — Telephone Encounter (Signed)
OK, I'll inject Thx

## 2015-08-29 NOTE — Telephone Encounter (Signed)
[place]ing back to [place] to make appt...Raechel Chute/lmb

## 2015-08-29 NOTE — Telephone Encounter (Signed)
Patient is needing a steroid shot in her knee and her rheumatologist is not available.  Is this something that can be done at her next appointment with Dr. Posey ReaPlotnikov? Please advise

## 2015-08-30 NOTE — Telephone Encounter (Signed)
appt made

## 2015-09-06 ENCOUNTER — Encounter: Payer: Self-pay | Admitting: Internal Medicine

## 2015-09-06 ENCOUNTER — Ambulatory Visit (INDEPENDENT_AMBULATORY_CARE_PROVIDER_SITE_OTHER): Payer: PPO | Admitting: Internal Medicine

## 2015-09-06 ENCOUNTER — Other Ambulatory Visit (INDEPENDENT_AMBULATORY_CARE_PROVIDER_SITE_OTHER): Payer: PPO

## 2015-09-06 VITALS — BP 120/80 | HR 64 | Wt 188.0 lb

## 2015-09-06 DIAGNOSIS — Z0000 Encounter for general adult medical examination without abnormal findings: Secondary | ICD-10-CM

## 2015-09-06 DIAGNOSIS — I10 Essential (primary) hypertension: Secondary | ICD-10-CM | POA: Diagnosis not present

## 2015-09-06 DIAGNOSIS — M25562 Pain in left knee: Secondary | ICD-10-CM

## 2015-09-06 DIAGNOSIS — G4700 Insomnia, unspecified: Secondary | ICD-10-CM | POA: Diagnosis not present

## 2015-09-06 DIAGNOSIS — M5440 Lumbago with sciatica, unspecified side: Secondary | ICD-10-CM

## 2015-09-06 DIAGNOSIS — G4710 Hypersomnia, unspecified: Secondary | ICD-10-CM

## 2015-09-06 DIAGNOSIS — F32A Depression, unspecified: Secondary | ICD-10-CM

## 2015-09-06 DIAGNOSIS — F329 Major depressive disorder, single episode, unspecified: Secondary | ICD-10-CM

## 2015-09-06 DIAGNOSIS — R202 Paresthesia of skin: Secondary | ICD-10-CM

## 2015-09-06 DIAGNOSIS — G8929 Other chronic pain: Secondary | ICD-10-CM

## 2015-09-06 LAB — TSH: TSH: 2.67 u[IU]/mL (ref 0.35–4.50)

## 2015-09-06 LAB — URINALYSIS, ROUTINE W REFLEX MICROSCOPIC
Bilirubin Urine: NEGATIVE
Hgb urine dipstick: NEGATIVE
Ketones, ur: NEGATIVE
Nitrite: NEGATIVE
PH: 6 (ref 5.0–8.0)
SPECIFIC GRAVITY, URINE: 1.01 (ref 1.000–1.030)
TOTAL PROTEIN, URINE-UPE24: NEGATIVE
Urine Glucose: NEGATIVE
Urobilinogen, UA: 0.2 (ref 0.0–1.0)

## 2015-09-06 LAB — CBC WITH DIFFERENTIAL/PLATELET
BASOS ABS: 0 10*3/uL (ref 0.0–0.1)
Basophils Relative: 0.5 % (ref 0.0–3.0)
EOS PCT: 3 % (ref 0.0–5.0)
Eosinophils Absolute: 0.2 10*3/uL (ref 0.0–0.7)
HEMATOCRIT: 39.3 % (ref 36.0–46.0)
Hemoglobin: 13 g/dL (ref 12.0–15.0)
LYMPHS PCT: 40.7 % (ref 12.0–46.0)
Lymphs Abs: 2.4 10*3/uL (ref 0.7–4.0)
MCHC: 33.1 g/dL (ref 30.0–36.0)
MCV: 89.7 fl (ref 78.0–100.0)
MONOS PCT: 7.7 % (ref 3.0–12.0)
Monocytes Absolute: 0.5 10*3/uL (ref 0.1–1.0)
NEUTROS ABS: 2.9 10*3/uL (ref 1.4–7.7)
Neutrophils Relative %: 48.1 % (ref 43.0–77.0)
Platelets: 257 10*3/uL (ref 150.0–400.0)
RBC: 4.39 Mil/uL (ref 3.87–5.11)
RDW: 14.9 % (ref 11.5–15.5)
WBC: 6 10*3/uL (ref 4.0–10.5)

## 2015-09-06 LAB — BASIC METABOLIC PANEL
BUN: 17 mg/dL (ref 6–23)
CALCIUM: 9.6 mg/dL (ref 8.4–10.5)
CO2: 30 meq/L (ref 19–32)
Chloride: 105 mEq/L (ref 96–112)
Creatinine, Ser: 1.2 mg/dL (ref 0.40–1.20)
GFR: 56.95 mL/min — ABNORMAL LOW (ref >60.00)
GLUCOSE: 80 mg/dL (ref 70–99)
POTASSIUM: 3.9 meq/L (ref 3.5–5.1)
SODIUM: 142 meq/L (ref 135–145)

## 2015-09-06 LAB — HEPATIC FUNCTION PANEL
ALBUMIN: 4.1 g/dL (ref 3.5–5.2)
ALK PHOS: 94 U/L (ref 39–117)
ALT: 10 U/L (ref 0–35)
AST: 16 U/L (ref 0–37)
Bilirubin, Direct: 0.1 mg/dL (ref 0.0–0.3)
TOTAL PROTEIN: 7.2 g/dL (ref 6.0–8.3)
Total Bilirubin: 0.5 mg/dL (ref 0.2–1.2)

## 2015-09-06 MED ORDER — MORPHINE SULFATE ER 15 MG PO TBCR: 15.0000 mg | EXTENDED_RELEASE_TABLET | Freq: Two times a day (BID) | ORAL | Status: DC

## 2015-09-06 MED ORDER — METHYLPREDNISOLONE ACETATE 80 MG/ML IJ SUSP
80.0000 mg | Freq: Once | INTRAMUSCULAR | Status: AC
  Administered 2015-09-06: 80 mg via INTRA_ARTICULAR

## 2015-09-06 NOTE — Patient Instructions (Signed)
Postprocedure instructions :    A Band-Aid should be left on for 12 hours. Injection therapy is not a cure itself. It is used in conjunction with other modalities. You can use nonsteroidal anti-inflammatories like ibuprofen , hot and cold compresses. Rest is recommended in the next 24 hours. You need to report immediately  if fever, chills or any signs of infection develop. 

## 2015-09-06 NOTE — Progress Notes (Signed)
Pre visit review using our clinic review tool, if applicable. No additional management support is needed unless otherwise documented below in the visit note. 

## 2015-09-06 NOTE — Assessment & Plan Note (Signed)
Potential benefits of a long term opioids use as well as potential risks (i.e. addiction risk, apnea etc) and complications (i.e. Somnolence, constipation and others) were explained to the patient and were aknowledged. MSContin

## 2015-09-06 NOTE — Assessment & Plan Note (Signed)
Worse Will inject today

## 2015-09-06 NOTE — Assessment & Plan Note (Signed)
Not on Rx 

## 2015-09-06 NOTE — Progress Notes (Signed)
Subjective:  Patient ID: [name] [name], female    DOB: 02-Dec-1945  Age: 69 y.o. MRN: [id number]  CC: No chief complaint on file.   HPI [name] [name] presents for OA, LBP, FMS f/u. C/o severe L knee pain.  Outpatient Prescriptions Prior to Visit  Medication Sig Dispense Refill  . acetaminophen (TYLENOL) 500 MG tablet Take by mouth as needed.    . Ascorbic Acid (VITAMIN C PO) Take 500 Units by mouth daily.     [name] aspirin 81 MG EC tablet Take 81 mg by mouth daily.      . B Complex Vitamins (B COMPLEX PO) Take by mouth daily.    . Cholecalciferol 1000 UNITS tablet Take 1,000 Units by mouth daily.      . Coenzyme Q10 (COQ-10) 100 MG CAPS Take 100 mg by mouth 1 day or 1 dose.    . estradiol (ESTRACE) 0.5 MG tablet Take 0.5 mg by mouth daily.    [name] losartan-hydrochlorothiazide (HYZAAR) 100-25 MG per tablet Take 1 tablet by mouth daily. 90 tablet 3  . methimazole (TAPAZOLE) 10 MG tablet Take 1 tablet (10 mg total) by mouth daily. 90 tablet 3  . metoprolol (LOPRESSOR) 50 MG tablet Take 2 tablets (100 mg total) by mouth 2 (two) times daily. 180 tablet 3  . morphine (MS CONTIN) 15 MG 12 hr tablet Take 1 tablet (15 mg total) by mouth 2 (two) times daily. May fill on or after 09/15/2015 60 tablet 0  . pantoprazole (PROTONIX) 40 MG tablet Take 1 tablet (40 mg total) by mouth daily. 90 tablet 3   No facility-administered medications prior to visit.    ROS Review of Systems  Constitutional: Negative for chills, activity change, appetite change, fatigue and unexpected weight change.  HENT: Negative for congestion, mouth sores and sinus pressure.   Eyes: Negative for visual disturbance.  Respiratory: Negative for cough and chest tightness.   Gastrointestinal: Negative for nausea and abdominal pain.  Genitourinary: Negative for frequency, difficulty urinating and vaginal pain.  Musculoskeletal: Positive for joint swelling and arthralgias. Negative for back pain and gait problem.  Skin: Negative for  pallor and rash.  Neurological: Negative for dizziness, tremors, weakness, numbness and headaches.  Psychiatric/Behavioral: Negative for confusion and sleep disturbance.    Objective:  BP 120/80 mmHg  Pulse 64  Wt 188 lb (85.276 kg)  SpO2 98%  BP Readings from Last 3 Encounters:  09/06/15 120/80  07/05/15 136/86  06/28/15 122/70    Wt Readings from Last 3 Encounters:  09/06/15 188 lb (85.276 kg)  07/05/15 185 lb (83.915 kg)  06/28/15 187 lb 4 oz (84.936 kg)    Physical Exam  Constitutional: She appears well-developed. No distress.  HENT:  Head: Normocephalic.  Right Ear: External ear normal.  Left Ear: External ear normal.  Nose: Nose normal.  Mouth/Throat: Oropharynx is clear and moist.  Eyes: Conjunctivae are normal. Pupils are equal, round, and reactive to light. Right eye exhibits no discharge. Left eye exhibits no discharge.  Neck: Normal range of motion. Neck supple. No JVD present. No tracheal deviation present. No thyromegaly present.  Cardiovascular: Normal rate, regular rhythm and normal heart sounds.   Pulmonary/Chest: No stridor. No respiratory distress. She has no wheezes.  Abdominal: Soft. Bowel sounds are normal. She exhibits no distension and no mass. There is no tenderness. There is no rebound and no guarding.  Musculoskeletal: She exhibits tenderness. She exhibits no edema.  Lymphadenopathy:    She has no cervical adenopathy.  Neurological: She displays normal reflexes. No cranial nerve deficit. She exhibits normal muscle tone. Coordination normal.  Skin: No rash noted. No erythema.  Psychiatric: She has a normal mood and affect. Her behavior is normal. Judgment and thought content normal.  L knee is tender  Lab Results  Component Value Date   WBC 7.3 03/28/2015   HGB 12.8 03/28/2015   HCT 38.1 03/28/2015   PLT 252.0 03/28/2015   GLUCOSE 98 03/28/2015   CHOL 158 02/19/2014   TRIG 108.0 02/19/2014   HDL 67.70 02/19/2014   LDLCALC 69 02/19/2014    ALT 11 03/28/2015   AST 18 03/28/2015   NA 137 03/28/2015   K 4.3 03/28/2015   CL 100 03/28/2015   CREATININE 1.25* 03/28/2015   BUN 20 03/28/2015   CO2 26 03/28/2015   TSH 1.16 03/28/2015    Dexascan  07/02/2015  Date of study: 06/28/2015 Exam: DUAL X-RAY ABSORPTIOMETRY (DXA) FOR BONE MINERAL DENSITY (BMD) Instrument: [employer] Therapist, art Provider: PCP Indication: screening for osteoporosis Comparison: none (please note that it is not possible to compare data from different instruments) Clinical data: Pt is a postmenopausal 69 y.o. female without previous nontraumatic fractures. On estrogen and vitamin D Results:  Lumbar spine L1-L3 (L4) Femoral neck (FN) T-score  +2.0  RFN: -0.9 LFN: -1.0  Assessment: the BMD is normal according to the [facility] classification for osteoporosis (see below). Fracture risk: low FRAX score: not calculated due to normal BMD Comments: the technical quality of the study is good, however, L4 vertebra had to be excluded from analysis. Recommend optimizing calcium (1200 mg/day) and vitamin D (800 IU/day) intake. Followup: Repeat BMD is appropriate after 2 years. WHO criteria for diagnosis of osteoporosis in postmenopausal women and in men 67 y/o or older: - normal: T-score -1.0 to + 1.0 - osteopenia/low bone density: T-score between -2.5 and -1.0 - osteoporosis: T-score below -2.5 - severe osteoporosis: T-score below -2.5 with history of fragility fracture Note: although not part of the WHO classification, the presence of a fragility fracture, regardless of the T-score, should be considered diagnostic of osteoporosis, provided other causes for the fracture have been excluded. Carlus Pavlov, MD [facility]    Procedure Note :     Procedure : Joint Injection, L  knee   Indication:  Joint osteoarthritis with refractory  chronic pain.   Risks including unsuccessful procedure , bleeding, infection, bruising, skin atrophy, "steroid flare-up" and others were  explained to the patient in detail as well as the benefits. Informed consent was obtained and signed.   Tthe patient was placed in a comfortable position. Lateral approach was used. Skin was prepped with Betadine and alcohol  and anesthetized a cooling spray. Then, a 5 cc syringe with a 1.5 inch long 25-gauge needle was used for a joint injection.. The needle was advanced  Into the knee joint cavity. I aspirated a small amount of intra-articular fluid to confirm correct placement of the needle and injected the joint with 5 mL of 2% lidocaine and 40 mg of Depo-Medrol .  Band-Aid was applied.   Tolerated well. Complications: None. Good pain relief following the procedure.    Assessment & Plan:   Diagnoses and all orders for this visit:  Essential hypertension -     morphine (MS CONTIN) 15 MG 12 hr tablet; Take 1 tablet (15 mg total) by mouth 2 (two) times daily. May fill on or after 09/15/2015  INSOMNIA, CHRONIC -     morphine (MS CONTIN)  15 MG 12 hr tablet; Take 1 tablet (15 mg total) by mouth 2 (two) times daily. May fill on or after 09/15/2015  Hypersomnia -     morphine (MS CONTIN) 15 MG 12 hr tablet; Take 1 tablet (15 mg total) by mouth 2 (two) times daily. May fill on or after 09/15/2015  Midline low back pain with sciatica, sciatica laterality unspecified -     morphine (MS CONTIN) 15 MG 12 hr tablet; Take 1 tablet (15 mg total) by mouth 2 (two) times daily. May fill on or after 09/15/2015  Paresthesia -     morphine (MS CONTIN) 15 MG 12 hr tablet; Take 1 tablet (15 mg total) by mouth 2 (two) times daily. May fill on or after 09/15/2015   I am having [name] [name] maintain her aspirin, Cholecalciferol, Ascorbic Acid (VITAMIN C PO), B Complex Vitamins (B COMPLEX PO), pantoprazole, metoprolol, methimazole, losartan-hydrochlorothiazide, estradiol, acetaminophen, CoQ-10, and morphine.  No orders of the defined types were placed in this encounter.     Follow-up: No Follow-up on  file.  Sonda PrimesAlex Plotnikov, MD

## 2015-09-06 NOTE — Assessment & Plan Note (Signed)
  Metoprolol, Losartan HCT

## 2015-09-25 ENCOUNTER — Encounter: Payer: Self-pay | Admitting: Internal Medicine

## 2015-10-01 ENCOUNTER — Other Ambulatory Visit: Payer: Self-pay | Admitting: Internal Medicine

## 2015-10-01 DIAGNOSIS — M25569 Pain in unspecified knee: Principal | ICD-10-CM

## 2015-10-01 DIAGNOSIS — G8929 Other chronic pain: Secondary | ICD-10-CM

## 2015-10-04 ENCOUNTER — Ambulatory Visit: Payer: PPO | Admitting: Internal Medicine

## 2015-10-08 ENCOUNTER — Telehealth: Payer: Self-pay | Admitting: *Deleted

## 2015-10-08 DIAGNOSIS — G4710 Hypersomnia, unspecified: Secondary | ICD-10-CM

## 2015-10-08 DIAGNOSIS — G4700 Insomnia, unspecified: Secondary | ICD-10-CM

## 2015-10-08 DIAGNOSIS — M5440 Lumbago with sciatica, unspecified side: Secondary | ICD-10-CM

## 2015-10-08 DIAGNOSIS — R202 Paresthesia of skin: Secondary | ICD-10-CM

## 2015-10-08 DIAGNOSIS — I10 Essential (primary) hypertension: Secondary | ICD-10-CM

## 2015-10-08 MED ORDER — MORPHINE SULFATE ER 15 MG PO TBCR: 15.0000 mg | EXTENDED_RELEASE_TABLET | Freq: Two times a day (BID) | ORAL | Status: DC

## 2015-10-08 MED ORDER — MORPHINE SULFATE ER 15 MG PO TBCR
15.0000 mg | EXTENDED_RELEASE_TABLET | Freq: Two times a day (BID) | ORAL | Status: DC
Start: 2015-10-08 — End: 2015-10-08

## 2015-10-08 NOTE — Telephone Encounter (Signed)
Pt brought old scripts back verified incorrect dates gave pt new rx's for Dec & Jan.../lmb

## 2015-10-08 NOTE — Telephone Encounter (Signed)
:  eft msg on triage stating md gave 3 rx's for her morphine filled Nov on 11th, it's time for another refill but on Dec & Jan rx's md has to fill on 10/15/15 which is not correct...Raechel Chute/lmb

## 2015-10-08 NOTE — Telephone Encounter (Signed)
Called pt inform her to bring old prescriptions back and we will re-issue with correct date. Generated Dec & Jan.../lmb

## 2015-10-08 NOTE — Telephone Encounter (Signed)
pls correct OV q 3 mo Thx

## 2015-10-17 ENCOUNTER — Encounter: Payer: Self-pay | Admitting: Internal Medicine

## 2015-10-17 ENCOUNTER — Other Ambulatory Visit: Payer: Self-pay | Admitting: Internal Medicine

## 2015-11-05 ENCOUNTER — Ambulatory Visit: Payer: PPO | Admitting: Internal Medicine

## 2015-11-12 ENCOUNTER — Encounter: Payer: Self-pay | Admitting: Internal Medicine

## 2015-11-12 ENCOUNTER — Ambulatory Visit (INDEPENDENT_AMBULATORY_CARE_PROVIDER_SITE_OTHER): Payer: PPO | Admitting: Internal Medicine

## 2015-11-12 VITALS — BP 108/58 | HR 69 | Wt 187.0 lb

## 2015-11-12 DIAGNOSIS — I10 Essential (primary) hypertension: Secondary | ICD-10-CM

## 2015-11-12 DIAGNOSIS — G4710 Hypersomnia, unspecified: Secondary | ICD-10-CM

## 2015-11-12 DIAGNOSIS — G894 Chronic pain syndrome: Secondary | ICD-10-CM

## 2015-11-12 DIAGNOSIS — M5440 Lumbago with sciatica, unspecified side: Secondary | ICD-10-CM | POA: Diagnosis not present

## 2015-11-12 DIAGNOSIS — R202 Paresthesia of skin: Secondary | ICD-10-CM

## 2015-11-12 DIAGNOSIS — G4700 Insomnia, unspecified: Secondary | ICD-10-CM | POA: Diagnosis not present

## 2015-11-12 DIAGNOSIS — K219 Gastro-esophageal reflux disease without esophagitis: Secondary | ICD-10-CM

## 2015-11-12 MED ORDER — METHIMAZOLE 10 MG PO TABS: 10.0000 mg | ORAL_TABLET | Freq: Every day | ORAL | Status: DC

## 2015-11-12 MED ORDER — MORPHINE SULFATE ER 15 MG PO TBCR: 15.0000 mg | EXTENDED_RELEASE_TABLET | Freq: Two times a day (BID) | ORAL | Status: DC

## 2015-11-12 MED ORDER — METOPROLOL TARTRATE 50 MG PO TABS: 100.0000 mg | ORAL_TABLET | Freq: Two times a day (BID) | ORAL | Status: AC

## 2015-11-12 MED ORDER — MORPHINE SULFATE ER 15 MG PO TBCR: 15.0000 mg | EXTENDED_RELEASE_TABLET | Freq: Two times a day (BID) | ORAL | Status: AC

## 2015-11-12 MED ORDER — PANTOPRAZOLE SODIUM 40 MG PO TBEC: 40.0000 mg | DELAYED_RELEASE_TABLET | Freq: Every day | ORAL | Status: AC

## 2015-11-12 MED ORDER — ESTRADIOL 0.5 MG PO TABS: 0.5000 mg | ORAL_TABLET | Freq: Every day | ORAL | Status: AC

## 2015-11-12 NOTE — Assessment & Plan Note (Signed)
Potential benefits of a long term opioids use as well as potential risks (i.e. addiction risk, apnea etc) and complications (i.e. Somnolence, constipation and others) were explained to the patient and were aknowledged. MS contin

## 2015-11-12 NOTE — Progress Notes (Signed)
Subjective:  Patient ID: [name] [name], female    DOB: 1946/01/08  Age: 70 y.o. MRN: [id number]  CC: No chief complaint on file.   HPI Carlye Panameno presents for chronic pain, GERD, HTN f/u. Pt is moving to her hometown.  Outpatient Prescriptions Prior to Visit  Medication Sig Dispense Refill  . acetaminophen (TYLENOL) 500 MG tablet Take by mouth as needed.    . Ascorbic Acid (VITAMIN C PO) Take 500 Units by mouth daily.     [name] aspirin 81 MG EC tablet Take 81 mg by mouth daily.      . B Complex Vitamins (B COMPLEX PO) Take by mouth daily.    . Cholecalciferol 1000 UNITS tablet Take 1,000 Units by mouth daily.      . Coenzyme Q10 (COQ-10) 100 MG CAPS Take 100 mg by mouth 1 day or 1 dose.    . losartan-hydrochlorothiazide (HYZAAR) 100-25 MG tablet TAKE ONE TABLET BY MOUTH ONCE DAILY 90 tablet 3  . estradiol (ESTRACE) 0.5 MG tablet Take 0.5 mg by mouth daily.    . methimazole (TAPAZOLE) 10 MG tablet Take 1 tablet (10 mg total) by mouth daily. 90 tablet 3  . metoprolol (LOPRESSOR) 50 MG tablet Take 2 tablets (100 mg total) by mouth 2 (two) times daily. 180 tablet 3  . morphine (MS CONTIN) 15 MG 12 hr tablet Take 1 tablet (15 mg total) by mouth 2 (two) times daily. May fill on or after 11/08/2015 60 tablet 0  . pantoprazole (PROTONIX) 40 MG tablet Take 1 tablet (40 mg total) by mouth daily. 90 tablet 3   No facility-administered medications prior to visit.    ROS Review of Systems  Constitutional: Negative for chills, activity change, appetite change, fatigue and unexpected weight change.  HENT: Negative for congestion, mouth sores and sinus pressure.   Eyes: Negative for visual disturbance.  Respiratory: Negative for cough and chest tightness.   Gastrointestinal: Negative for nausea and abdominal pain.  Genitourinary: Negative for frequency, difficulty urinating and vaginal pain.  Musculoskeletal: Positive for back pain and arthralgias. Negative for gait problem.  Skin: Negative for  pallor and rash.  Neurological: Negative for dizziness, tremors, weakness, numbness and headaches.  Psychiatric/Behavioral: Negative for suicidal ideas, confusion and sleep disturbance. The patient is nervous/anxious.     Objective:  BP 108/58 mmHg  Pulse 69  Wt 187 lb (84.823 kg)  SpO2 98%  BP Readings from Last 3 Encounters:  11/12/15 108/58  09/06/15 120/80  07/05/15 136/86    Wt Readings from Last 3 Encounters:  11/12/15 187 lb (84.823 kg)  09/06/15 188 lb (85.276 kg)  07/05/15 185 lb (83.915 kg)    Physical Exam  Constitutional: She appears well-developed. No distress.  HENT:  Head: Normocephalic.  Right Ear: External ear normal.  Left Ear: External ear normal.  Nose: Nose normal.  Mouth/Throat: Oropharynx is clear and moist.  Eyes: Conjunctivae are normal. Pupils are equal, round, and reactive to light. Right eye exhibits no discharge. Left eye exhibits no discharge.  Neck: Normal range of motion. Neck supple. No JVD present. No tracheal deviation present. No thyromegaly present.  Cardiovascular: Normal rate, regular rhythm and normal heart sounds.   Pulmonary/Chest: No stridor. No respiratory distress. She has no wheezes.  Abdominal: Soft. Bowel sounds are normal. She exhibits no distension and no mass. There is no tenderness. There is no rebound and no guarding.  Musculoskeletal: She exhibits tenderness. She exhibits no edema.  Lymphadenopathy:    She has  no cervical adenopathy.  Neurological: She displays normal reflexes. No cranial nerve deficit. She exhibits normal muscle tone. Coordination normal.  Skin: No rash noted. No erythema.  Psychiatric: She has a normal mood and affect. Her behavior is normal. Judgment and thought content normal.  Obese LS tender  Lab Results  Component Value Date   WBC 6.0 09/06/2015   HGB 13.0 09/06/2015   HCT 39.3 09/06/2015   PLT 257.0 09/06/2015   GLUCOSE 80 09/06/2015   CHOL 158 02/19/2014   TRIG 108.0 02/19/2014   HDL  67.70 02/19/2014   LDLCALC 69 02/19/2014   ALT 10 09/06/2015   AST 16 09/06/2015   NA 142 09/06/2015   K 3.9 09/06/2015   CL 105 09/06/2015   CREATININE 1.20 09/06/2015   BUN 17 09/06/2015   CO2 30 09/06/2015   TSH 2.67 09/06/2015    Dexascan  07/02/2015  Date of study: 06/28/2015 Exam: DUAL X-RAY ABSORPTIOMETRY (DXA) FOR BONE MINERAL DENSITY (BMD) Instrument: [employer] Therapist, art Provider: PCP Indication: screening for osteoporosis Comparison: none (please note that it is not possible to compare data from different instruments) Clinical data: Pt is a postmenopausal 70 y.o. female without previous nontraumatic fractures. On estrogen and vitamin D Results:  Lumbar spine L1-L3 (L4) Femoral neck (FN) T-score  +2.0  RFN: -0.9 LFN: -1.0  Assessment: the BMD is normal according to the [facility] classification for osteoporosis (see below). Fracture risk: low FRAX score: not calculated due to normal BMD Comments: the technical quality of the study is good, however, L4 vertebra had to be excluded from analysis. Recommend optimizing calcium (1200 mg/day) and vitamin D (800 IU/day) intake. Followup: Repeat BMD is appropriate after 2 years. WHO criteria for diagnosis of osteoporosis in postmenopausal women and in men 12 y/o or older: - normal: T-score -1.0 to + 1.0 - osteopenia/low bone density: T-score between -2.5 and -1.0 - osteoporosis: T-score below -2.5 - severe osteoporosis: T-score below -2.5 with history of fragility fracture Note: although not part of the WHO classification, the presence of a fragility fracture, regardless of the T-score, should be considered diagnostic of osteoporosis, provided other causes for the fracture have been excluded. Carlus Pavlov, MD [facility]    Assessment & Plan:   Diagnoses and all orders for this visit:  Essential hypertension -     Discontinue: morphine (MS CONTIN) 15 MG 12 hr tablet; Take 1 tablet (15 mg total) by mouth 2 (two) times daily. May  fill on or after 12/09/2015 -     Discontinue: morphine (MS CONTIN) 15 MG 12 hr tablet; Take 1 tablet (15 mg total) by mouth 2 (two) times daily. May fill on or after 01/06/2016 -     morphine (MS CONTIN) 15 MG 12 hr tablet; Take 1 tablet (15 mg total) by mouth 2 (two) times daily. May fill on or after 02/06/2016  INSOMNIA, CHRONIC -     Discontinue: morphine (MS CONTIN) 15 MG 12 hr tablet; Take 1 tablet (15 mg total) by mouth 2 (two) times daily. May fill on or after 12/09/2015 -     Discontinue: morphine (MS CONTIN) 15 MG 12 hr tablet; Take 1 tablet (15 mg total) by mouth 2 (two) times daily. May fill on or after 01/06/2016 -     morphine (MS CONTIN) 15 MG 12 hr tablet; Take 1 tablet (15 mg total) by mouth 2 (two) times daily. May fill on or after 02/06/2016  Hypersomnia -     Discontinue: morphine (MS  CONTIN) 15 MG 12 hr tablet; Take 1 tablet (15 mg total) by mouth 2 (two) times daily. May fill on or after 12/09/2015 -     Discontinue: morphine (MS CONTIN) 15 MG 12 hr tablet; Take 1 tablet (15 mg total) by mouth 2 (two) times daily. May fill on or after 01/06/2016 -     morphine (MS CONTIN) 15 MG 12 hr tablet; Take 1 tablet (15 mg total) by mouth 2 (two) times daily. May fill on or after 02/06/2016  Midline low back pain with sciatica, sciatica laterality unspecified -     Discontinue: morphine (MS CONTIN) 15 MG 12 hr tablet; Take 1 tablet (15 mg total) by mouth 2 (two) times daily. May fill on or after 12/09/2015 -     Discontinue: morphine (MS CONTIN) 15 MG 12 hr tablet; Take 1 tablet (15 mg total) by mouth 2 (two) times daily. May fill on or after 01/06/2016 -     morphine (MS CONTIN) 15 MG 12 hr tablet; Take 1 tablet (15 mg total) by mouth 2 (two) times daily. May fill on or after 02/06/2016  Paresthesia -     Discontinue: morphine (MS CONTIN) 15 MG 12 hr tablet; Take 1 tablet (15 mg total) by mouth 2 (two) times daily. May fill on or after 12/09/2015 -     Discontinue: morphine (MS  CONTIN) 15 MG 12 hr tablet; Take 1 tablet (15 mg total) by mouth 2 (two) times daily. May fill on or after 01/06/2016 -     morphine (MS CONTIN) 15 MG 12 hr tablet; Take 1 tablet (15 mg total) by mouth 2 (two) times daily. May fill on or after 02/06/2016  Gastroesophageal reflux disease without esophagitis  Chronic pain syndrome  Other orders -     estradiol (ESTRACE) 0.5 MG tablet; Take 1 tablet (0.5 mg total) by mouth daily. -     metoprolol (LOPRESSOR) 50 MG tablet; Take 2 tablets (100 mg total) by mouth 2 (two) times daily. -     methimazole (TAPAZOLE) 10 MG tablet; Take 1 tablet (10 mg total) by mouth daily. -     pantoprazole (PROTONIX) 40 MG tablet; Take 1 tablet (40 mg total) by mouth daily.   I have discontinued Ms. Schippers's morphine and morphine. I have also changed her estradiol and morphine. Additionally, I am having her maintain her aspirin, Cholecalciferol, Ascorbic Acid (VITAMIN C PO), B Complex Vitamins (B COMPLEX PO), acetaminophen, CoQ-10, losartan-hydrochlorothiazide, metoprolol, methimazole, and pantoprazole.  Meds ordered this encounter  Medications  . DISCONTD: morphine (MS CONTIN) 15 MG 12 hr tablet    Sig: Take 1 tablet (15 mg total) by mouth 2 (two) times daily. May fill on or after 12/09/2015    Dispense:  60 tablet    Refill:  0  . estradiol (ESTRACE) 0.5 MG tablet    Sig: Take 1 tablet (0.5 mg total) by mouth daily.    Dispense:  90 tablet    Refill:  3  . metoprolol (LOPRESSOR) 50 MG tablet    Sig: Take 2 tablets (100 mg total) by mouth 2 (two) times daily.    Dispense:  180 tablet    Refill:  3  . methimazole (TAPAZOLE) 10 MG tablet    Sig: Take 1 tablet (10 mg total) by mouth daily.    Dispense:  90 tablet    Refill:  3  . pantoprazole (PROTONIX) 40 MG tablet    Sig: Take 1 tablet (40 mg total) by  mouth daily.    Dispense:  90 tablet    Refill:  3  . DISCONTD: morphine (MS CONTIN) 15 MG 12 hr tablet    Sig: Take 1 tablet (15 mg total) by mouth 2  (two) times daily. May fill on or after 01/06/2016    Dispense:  60 tablet    Refill:  0  . morphine (MS CONTIN) 15 MG 12 hr tablet    Sig: Take 1 tablet (15 mg total) by mouth 2 (two) times daily. May fill on or after 02/06/2016    Dispense:  60 tablet    Refill:  0     Follow-up: No Follow-up on file.  Sonda Primes, MD

## 2015-11-12 NOTE — Progress Notes (Signed)
Pre visit review using our clinic review tool, if applicable. No additional management support is needed unless otherwise documented below in the visit note. 

## 2015-11-12 NOTE — Assessment & Plan Note (Signed)
Protonix.  ?

## 2015-11-27 ENCOUNTER — Encounter: Payer: Self-pay | Admitting: Gastroenterology

## 2016-01-07 ENCOUNTER — Telehealth: Payer: Self-pay | Admitting: Internal Medicine

## 2016-01-07 NOTE — Telephone Encounter (Signed)
Patient states that she has moved out of town and is looking for a new provider.  She has ran out of medication and is requesting Dr. Posey ReaPlotnikov to fill meds.  Patient needs losartan and methimazole.  Patient is requesting scripts to go to [facility].

## 2016-01-08 MED ORDER — METHIMAZOLE 10 MG PO TABS: 10.0000 mg | ORAL_TABLET | Freq: Every day | ORAL | Status: DC

## 2016-01-08 MED ORDER — LOSARTAN POTASSIUM-HCTZ 100-25 MG PO TABS: 1.0000 | ORAL_TABLET | Freq: Every day | ORAL | Status: DC

## 2016-01-08 NOTE — Telephone Encounter (Signed)
Done. See meds. Pt informed  

## 2016-03-11 ENCOUNTER — Other Ambulatory Visit: Payer: Self-pay | Admitting: Internal Medicine

## 2016-04-19 DIAGNOSIS — G8929 Other chronic pain: Secondary | ICD-10-CM | POA: Diagnosis not present

## 2016-04-19 DIAGNOSIS — M545 Low back pain: Secondary | ICD-10-CM | POA: Diagnosis not present

## 2016-04-19 DIAGNOSIS — M5440 Lumbago with sciatica, unspecified side: Secondary | ICD-10-CM | POA: Diagnosis not present

## 2016-04-21 DIAGNOSIS — K219 Gastro-esophageal reflux disease without esophagitis: Secondary | ICD-10-CM | POA: Diagnosis not present

## 2016-04-21 DIAGNOSIS — I10 Essential (primary) hypertension: Secondary | ICD-10-CM | POA: Diagnosis not present

## 2016-04-21 DIAGNOSIS — R52 Pain, unspecified: Secondary | ICD-10-CM | POA: Diagnosis not present

## 2016-04-21 DIAGNOSIS — M5126 Other intervertebral disc displacement, lumbar region: Secondary | ICD-10-CM | POA: Diagnosis not present

## 2016-04-21 DIAGNOSIS — Z79891 Long term (current) use of opiate analgesic: Secondary | ICD-10-CM | POA: Diagnosis not present

## 2016-04-21 DIAGNOSIS — Z13220 Encounter for screening for lipoid disorders: Secondary | ICD-10-CM | POA: Diagnosis not present

## 2016-05-18 DIAGNOSIS — N289 Disorder of kidney and ureter, unspecified: Secondary | ICD-10-CM | POA: Diagnosis not present

## 2016-05-18 DIAGNOSIS — M5126 Other intervertebral disc displacement, lumbar region: Secondary | ICD-10-CM | POA: Diagnosis not present

## 2016-05-18 DIAGNOSIS — G894 Chronic pain syndrome: Secondary | ICD-10-CM | POA: Diagnosis not present

## 2016-05-18 DIAGNOSIS — E781 Pure hyperglyceridemia: Secondary | ICD-10-CM | POA: Diagnosis not present

## 2016-05-18 DIAGNOSIS — I10 Essential (primary) hypertension: Secondary | ICD-10-CM | POA: Diagnosis not present

## 2016-05-18 DIAGNOSIS — Z79891 Long term (current) use of opiate analgesic: Secondary | ICD-10-CM | POA: Diagnosis not present

## 2016-05-25 DIAGNOSIS — Z79899 Other long term (current) drug therapy: Secondary | ICD-10-CM | POA: Diagnosis not present

## 2016-05-25 DIAGNOSIS — Z90710 Acquired absence of both cervix and uterus: Secondary | ICD-10-CM | POA: Diagnosis not present

## 2016-05-25 DIAGNOSIS — G894 Chronic pain syndrome: Secondary | ICD-10-CM | POA: Diagnosis not present

## 2016-05-25 DIAGNOSIS — M545 Low back pain: Secondary | ICD-10-CM | POA: Diagnosis not present

## 2016-05-25 DIAGNOSIS — K219 Gastro-esophageal reflux disease without esophagitis: Secondary | ICD-10-CM | POA: Diagnosis not present

## 2016-05-25 DIAGNOSIS — M4806 Spinal stenosis, lumbar region: Secondary | ICD-10-CM | POA: Diagnosis not present

## 2016-05-25 DIAGNOSIS — Z79891 Long term (current) use of opiate analgesic: Secondary | ICD-10-CM | POA: Diagnosis not present

## 2016-05-25 DIAGNOSIS — I10 Essential (primary) hypertension: Secondary | ICD-10-CM | POA: Diagnosis not present

## 2016-05-25 DIAGNOSIS — M25551 Pain in right hip: Secondary | ICD-10-CM | POA: Diagnosis not present

## 2016-05-25 DIAGNOSIS — E785 Hyperlipidemia, unspecified: Secondary | ICD-10-CM | POA: Diagnosis not present

## 2016-06-07 DIAGNOSIS — M47816 Spondylosis without myelopathy or radiculopathy, lumbar region: Secondary | ICD-10-CM | POA: Diagnosis not present

## 2016-06-19 DIAGNOSIS — M25551 Pain in right hip: Secondary | ICD-10-CM | POA: Diagnosis not present

## 2016-06-19 DIAGNOSIS — M4806 Spinal stenosis, lumbar region: Secondary | ICD-10-CM | POA: Diagnosis not present

## 2016-06-19 DIAGNOSIS — M47816 Spondylosis without myelopathy or radiculopathy, lumbar region: Secondary | ICD-10-CM | POA: Diagnosis not present

## 2016-06-19 DIAGNOSIS — M545 Low back pain: Secondary | ICD-10-CM | POA: Diagnosis not present

## 2016-06-19 DIAGNOSIS — M25552 Pain in left hip: Secondary | ICD-10-CM | POA: Diagnosis not present

## 2016-06-19 DIAGNOSIS — Z90710 Acquired absence of both cervix and uterus: Secondary | ICD-10-CM | POA: Diagnosis not present

## 2016-07-02 DIAGNOSIS — M47816 Spondylosis without myelopathy or radiculopathy, lumbar region: Secondary | ICD-10-CM | POA: Diagnosis not present

## 2016-07-02 DIAGNOSIS — E785 Hyperlipidemia, unspecified: Secondary | ICD-10-CM | POA: Diagnosis not present

## 2016-07-02 DIAGNOSIS — G8929 Other chronic pain: Secondary | ICD-10-CM | POA: Diagnosis not present

## 2016-07-02 DIAGNOSIS — K219 Gastro-esophageal reflux disease without esophagitis: Secondary | ICD-10-CM | POA: Diagnosis not present

## 2016-07-02 DIAGNOSIS — I10 Essential (primary) hypertension: Secondary | ICD-10-CM | POA: Diagnosis not present

## 2016-07-20 DIAGNOSIS — Z1231 Encounter for screening mammogram for malignant neoplasm of breast: Secondary | ICD-10-CM | POA: Diagnosis not present

## 2016-07-20 DIAGNOSIS — M5126 Other intervertebral disc displacement, lumbar region: Secondary | ICD-10-CM | POA: Diagnosis not present

## 2016-07-20 DIAGNOSIS — M25562 Pain in left knee: Secondary | ICD-10-CM | POA: Diagnosis not present

## 2016-07-20 DIAGNOSIS — I10 Essential (primary) hypertension: Secondary | ICD-10-CM | POA: Diagnosis not present

## 2016-07-20 DIAGNOSIS — E781 Pure hyperglyceridemia: Secondary | ICD-10-CM | POA: Diagnosis not present

## 2016-07-20 DIAGNOSIS — Z9889 Other specified postprocedural states: Secondary | ICD-10-CM | POA: Diagnosis not present

## 2016-07-20 DIAGNOSIS — Z853 Personal history of malignant neoplasm of breast: Secondary | ICD-10-CM | POA: Diagnosis not present

## 2016-07-22 DIAGNOSIS — M1288 Other specific arthropathies, not elsewhere classified, other specified site: Secondary | ICD-10-CM | POA: Diagnosis not present

## 2016-07-22 DIAGNOSIS — K219 Gastro-esophageal reflux disease without esophagitis: Secondary | ICD-10-CM | POA: Diagnosis not present

## 2016-07-22 DIAGNOSIS — M1712 Unilateral primary osteoarthritis, left knee: Secondary | ICD-10-CM | POA: Diagnosis not present

## 2016-07-22 DIAGNOSIS — I10 Essential (primary) hypertension: Secondary | ICD-10-CM | POA: Diagnosis not present

## 2016-07-22 DIAGNOSIS — M47816 Spondylosis without myelopathy or radiculopathy, lumbar region: Secondary | ICD-10-CM | POA: Diagnosis not present

## 2016-07-22 DIAGNOSIS — G8929 Other chronic pain: Secondary | ICD-10-CM | POA: Diagnosis not present

## 2016-07-22 DIAGNOSIS — E785 Hyperlipidemia, unspecified: Secondary | ICD-10-CM | POA: Diagnosis not present

## 2016-07-22 DIAGNOSIS — E236 Other disorders of pituitary gland: Secondary | ICD-10-CM | POA: Diagnosis not present

## 2016-07-22 DIAGNOSIS — E669 Obesity, unspecified: Secondary | ICD-10-CM | POA: Diagnosis not present

## 2016-07-23 DIAGNOSIS — M7989 Other specified soft tissue disorders: Secondary | ICD-10-CM | POA: Diagnosis not present

## 2016-07-23 DIAGNOSIS — M25562 Pain in left knee: Secondary | ICD-10-CM | POA: Diagnosis not present

## 2016-07-23 DIAGNOSIS — M12579 Traumatic arthropathy, unspecified ankle and foot: Secondary | ICD-10-CM | POA: Diagnosis not present

## 2016-07-23 DIAGNOSIS — M19072 Primary osteoarthritis, left ankle and foot: Secondary | ICD-10-CM | POA: Diagnosis not present

## 2016-07-30 DIAGNOSIS — Z79899 Other long term (current) drug therapy: Secondary | ICD-10-CM | POA: Diagnosis not present

## 2016-07-30 DIAGNOSIS — M1712 Unilateral primary osteoarthritis, left knee: Secondary | ICD-10-CM | POA: Diagnosis not present

## 2016-07-30 DIAGNOSIS — I10 Essential (primary) hypertension: Secondary | ICD-10-CM | POA: Diagnosis not present

## 2016-07-30 DIAGNOSIS — E785 Hyperlipidemia, unspecified: Secondary | ICD-10-CM | POA: Diagnosis not present

## 2016-07-30 DIAGNOSIS — G894 Chronic pain syndrome: Secondary | ICD-10-CM | POA: Diagnosis not present

## 2016-07-30 DIAGNOSIS — Z79891 Long term (current) use of opiate analgesic: Secondary | ICD-10-CM | POA: Diagnosis not present

## 2016-07-30 DIAGNOSIS — K219 Gastro-esophageal reflux disease without esophagitis: Secondary | ICD-10-CM | POA: Diagnosis not present

## 2016-07-30 DIAGNOSIS — M48061 Spinal stenosis, lumbar region without neurogenic claudication: Secondary | ICD-10-CM | POA: Diagnosis not present

## 2016-07-30 DIAGNOSIS — M47816 Spondylosis without myelopathy or radiculopathy, lumbar region: Secondary | ICD-10-CM | POA: Diagnosis not present

## 2016-08-07 DIAGNOSIS — E785 Hyperlipidemia, unspecified: Secondary | ICD-10-CM | POA: Diagnosis not present

## 2016-08-07 DIAGNOSIS — M47816 Spondylosis without myelopathy or radiculopathy, lumbar region: Secondary | ICD-10-CM | POA: Diagnosis not present

## 2016-08-07 DIAGNOSIS — I10 Essential (primary) hypertension: Secondary | ICD-10-CM | POA: Diagnosis not present

## 2016-08-07 DIAGNOSIS — K219 Gastro-esophageal reflux disease without esophagitis: Secondary | ICD-10-CM | POA: Diagnosis not present

## 2016-08-07 DIAGNOSIS — M1712 Unilateral primary osteoarthritis, left knee: Secondary | ICD-10-CM | POA: Diagnosis not present

## 2016-08-07 DIAGNOSIS — M48061 Spinal stenosis, lumbar region without neurogenic claudication: Secondary | ICD-10-CM | POA: Diagnosis not present

## 2016-08-21 DIAGNOSIS — E785 Hyperlipidemia, unspecified: Secondary | ICD-10-CM | POA: Diagnosis not present

## 2016-08-21 DIAGNOSIS — K219 Gastro-esophageal reflux disease without esophagitis: Secondary | ICD-10-CM | POA: Diagnosis not present

## 2016-08-21 DIAGNOSIS — M1712 Unilateral primary osteoarthritis, left knee: Secondary | ICD-10-CM | POA: Diagnosis not present

## 2016-08-21 DIAGNOSIS — M47816 Spondylosis without myelopathy or radiculopathy, lumbar region: Secondary | ICD-10-CM | POA: Diagnosis not present

## 2016-08-21 DIAGNOSIS — G894 Chronic pain syndrome: Secondary | ICD-10-CM | POA: Diagnosis not present

## 2016-08-21 DIAGNOSIS — I10 Essential (primary) hypertension: Secondary | ICD-10-CM | POA: Diagnosis not present

## 2016-08-21 DIAGNOSIS — M25562 Pain in left knee: Secondary | ICD-10-CM | POA: Diagnosis not present

## 2016-08-21 DIAGNOSIS — M48061 Spinal stenosis, lumbar region without neurogenic claudication: Secondary | ICD-10-CM | POA: Diagnosis not present

## 2016-08-31 DIAGNOSIS — G8929 Other chronic pain: Secondary | ICD-10-CM | POA: Diagnosis not present

## 2016-08-31 DIAGNOSIS — M25562 Pain in left knee: Secondary | ICD-10-CM | POA: Diagnosis not present

## 2016-08-31 DIAGNOSIS — M1712 Unilateral primary osteoarthritis, left knee: Secondary | ICD-10-CM | POA: Diagnosis not present

## 2016-08-31 DIAGNOSIS — Z90710 Acquired absence of both cervix and uterus: Secondary | ICD-10-CM | POA: Diagnosis not present

## 2016-09-11 DIAGNOSIS — N644 Mastodynia: Secondary | ICD-10-CM | POA: Diagnosis not present

## 2016-09-18 DIAGNOSIS — N6320 Unspecified lump in the left breast, unspecified quadrant: Secondary | ICD-10-CM | POA: Diagnosis not present

## 2016-09-18 DIAGNOSIS — Z853 Personal history of malignant neoplasm of breast: Secondary | ICD-10-CM | POA: Diagnosis not present

## 2016-09-18 DIAGNOSIS — N644 Mastodynia: Secondary | ICD-10-CM | POA: Diagnosis not present

## 2016-09-18 DIAGNOSIS — N6321 Unspecified lump in the left breast, upper outer quadrant: Secondary | ICD-10-CM | POA: Diagnosis not present

## 2016-10-01 DIAGNOSIS — N6320 Unspecified lump in the left breast, unspecified quadrant: Secondary | ICD-10-CM | POA: Diagnosis not present

## 2016-10-01 DIAGNOSIS — N61 Mastitis without abscess: Secondary | ICD-10-CM | POA: Diagnosis not present

## 2016-10-01 DIAGNOSIS — N6321 Unspecified lump in the left breast, upper outer quadrant: Secondary | ICD-10-CM | POA: Diagnosis not present

## 2016-10-01 DIAGNOSIS — N641 Fat necrosis of breast: Secondary | ICD-10-CM | POA: Diagnosis not present

## 2016-11-11 DIAGNOSIS — M48061 Spinal stenosis, lumbar region without neurogenic claudication: Secondary | ICD-10-CM | POA: Diagnosis not present

## 2016-11-11 DIAGNOSIS — Z90710 Acquired absence of both cervix and uterus: Secondary | ICD-10-CM | POA: Diagnosis not present

## 2016-11-11 DIAGNOSIS — M545 Low back pain: Secondary | ICD-10-CM | POA: Diagnosis not present

## 2016-11-11 DIAGNOSIS — K219 Gastro-esophageal reflux disease without esophagitis: Secondary | ICD-10-CM | POA: Diagnosis not present

## 2016-11-11 DIAGNOSIS — Z79899 Other long term (current) drug therapy: Secondary | ICD-10-CM | POA: Diagnosis not present

## 2016-11-11 DIAGNOSIS — I10 Essential (primary) hypertension: Secondary | ICD-10-CM | POA: Diagnosis not present

## 2016-11-11 DIAGNOSIS — G894 Chronic pain syndrome: Secondary | ICD-10-CM | POA: Diagnosis not present

## 2016-11-11 DIAGNOSIS — M47816 Spondylosis without myelopathy or radiculopathy, lumbar region: Secondary | ICD-10-CM | POA: Diagnosis not present

## 2016-11-13 DIAGNOSIS — I10 Essential (primary) hypertension: Secondary | ICD-10-CM | POA: Diagnosis not present

## 2016-11-13 DIAGNOSIS — L739 Follicular disorder, unspecified: Secondary | ICD-10-CM | POA: Diagnosis not present

## 2016-11-13 DIAGNOSIS — G8929 Other chronic pain: Secondary | ICD-10-CM | POA: Diagnosis not present

## 2016-11-13 DIAGNOSIS — M5126 Other intervertebral disc displacement, lumbar region: Secondary | ICD-10-CM | POA: Diagnosis not present

## 2016-12-02 DIAGNOSIS — G8929 Other chronic pain: Secondary | ICD-10-CM | POA: Diagnosis not present

## 2016-12-02 DIAGNOSIS — M1712 Unilateral primary osteoarthritis, left knee: Secondary | ICD-10-CM | POA: Diagnosis not present

## 2016-12-02 DIAGNOSIS — M25562 Pain in left knee: Secondary | ICD-10-CM | POA: Diagnosis not present

## 2016-12-31 DIAGNOSIS — D242 Benign neoplasm of left breast: Secondary | ICD-10-CM | POA: Diagnosis not present

## 2016-12-31 DIAGNOSIS — N6459 Other signs and symptoms in breast: Secondary | ICD-10-CM | POA: Diagnosis not present

## 2017-01-05 DIAGNOSIS — Z86010 Personal history of colonic polyps: Secondary | ICD-10-CM | POA: Diagnosis not present

## 2017-01-05 DIAGNOSIS — M791 Myalgia: Secondary | ICD-10-CM | POA: Diagnosis not present

## 2017-01-05 DIAGNOSIS — R197 Diarrhea, unspecified: Secondary | ICD-10-CM | POA: Diagnosis not present

## 2017-01-18 DIAGNOSIS — M791 Myalgia: Secondary | ICD-10-CM | POA: Diagnosis not present

## 2017-01-19 DIAGNOSIS — R197 Diarrhea, unspecified: Secondary | ICD-10-CM | POA: Diagnosis not present

## 2017-01-19 DIAGNOSIS — K640 First degree hemorrhoids: Secondary | ICD-10-CM | POA: Diagnosis not present

## 2017-01-19 DIAGNOSIS — K5730 Diverticulosis of large intestine without perforation or abscess without bleeding: Secondary | ICD-10-CM | POA: Diagnosis not present

## 2017-01-19 DIAGNOSIS — Z86010 Personal history of colonic polyps: Secondary | ICD-10-CM | POA: Diagnosis not present

## 2017-01-22 DIAGNOSIS — K2970 Gastritis, unspecified, without bleeding: Secondary | ICD-10-CM | POA: Diagnosis not present

## 2017-01-22 DIAGNOSIS — R634 Abnormal weight loss: Secondary | ICD-10-CM | POA: Diagnosis not present

## 2017-01-22 DIAGNOSIS — K219 Gastro-esophageal reflux disease without esophagitis: Secondary | ICD-10-CM | POA: Diagnosis not present

## 2017-01-22 DIAGNOSIS — K3189 Other diseases of stomach and duodenum: Secondary | ICD-10-CM | POA: Diagnosis not present

## 2017-02-05 DIAGNOSIS — R6889 Other general symptoms and signs: Secondary | ICD-10-CM | POA: Diagnosis not present

## 2017-02-05 DIAGNOSIS — Z79899 Other long term (current) drug therapy: Secondary | ICD-10-CM | POA: Diagnosis not present

## 2017-02-05 DIAGNOSIS — G894 Chronic pain syndrome: Secondary | ICD-10-CM | POA: Diagnosis not present

## 2017-02-08 DIAGNOSIS — K52839 Microscopic colitis, unspecified: Secondary | ICD-10-CM | POA: Diagnosis not present

## 2017-02-08 DIAGNOSIS — K5730 Diverticulosis of large intestine without perforation or abscess without bleeding: Secondary | ICD-10-CM | POA: Diagnosis not present

## 2017-02-08 DIAGNOSIS — R197 Diarrhea, unspecified: Secondary | ICD-10-CM | POA: Diagnosis not present

## 2017-02-08 DIAGNOSIS — K2970 Gastritis, unspecified, without bleeding: Secondary | ICD-10-CM | POA: Diagnosis not present

## 2017-02-08 DIAGNOSIS — M329 Systemic lupus erythematosus, unspecified: Secondary | ICD-10-CM | POA: Diagnosis not present

## 2017-02-08 DIAGNOSIS — Z86010 Personal history of colonic polyps: Secondary | ICD-10-CM | POA: Diagnosis not present

## 2017-02-08 DIAGNOSIS — K640 First degree hemorrhoids: Secondary | ICD-10-CM | POA: Diagnosis not present

## 2017-02-19 DIAGNOSIS — R209 Unspecified disturbances of skin sensation: Secondary | ICD-10-CM | POA: Diagnosis not present

## 2017-02-26 ENCOUNTER — Other Ambulatory Visit: Payer: Self-pay | Admitting: Internal Medicine

## 2017-02-26 DIAGNOSIS — G894 Chronic pain syndrome: Secondary | ICD-10-CM | POA: Diagnosis not present

## 2017-02-26 DIAGNOSIS — G8929 Other chronic pain: Secondary | ICD-10-CM | POA: Diagnosis not present

## 2017-02-26 DIAGNOSIS — M47816 Spondylosis without myelopathy or radiculopathy, lumbar region: Secondary | ICD-10-CM | POA: Diagnosis not present

## 2017-02-26 DIAGNOSIS — M25562 Pain in left knee: Secondary | ICD-10-CM | POA: Diagnosis not present

## 2017-02-26 DIAGNOSIS — M48061 Spinal stenosis, lumbar region without neurogenic claudication: Secondary | ICD-10-CM | POA: Diagnosis not present

## 2017-03-01 DIAGNOSIS — M791 Myalgia: Secondary | ICD-10-CM | POA: Diagnosis not present

## 2017-03-01 DIAGNOSIS — R7989 Other specified abnormal findings of blood chemistry: Secondary | ICD-10-CM | POA: Diagnosis not present

## 2017-03-01 DIAGNOSIS — M25562 Pain in left knee: Secondary | ICD-10-CM | POA: Diagnosis not present

## 2017-03-01 DIAGNOSIS — M170 Bilateral primary osteoarthritis of knee: Secondary | ICD-10-CM | POA: Diagnosis not present

## 2017-03-01 DIAGNOSIS — R768 Other specified abnormal immunological findings in serum: Secondary | ICD-10-CM | POA: Diagnosis not present

## 2017-06-04 DIAGNOSIS — M48061 Spinal stenosis, lumbar region without neurogenic claudication: Secondary | ICD-10-CM | POA: Diagnosis not present

## 2017-06-04 DIAGNOSIS — M25562 Pain in left knee: Secondary | ICD-10-CM | POA: Diagnosis not present

## 2017-06-04 DIAGNOSIS — G894 Chronic pain syndrome: Secondary | ICD-10-CM | POA: Diagnosis not present

## 2017-06-04 DIAGNOSIS — M47816 Spondylosis without myelopathy or radiculopathy, lumbar region: Secondary | ICD-10-CM | POA: Diagnosis not present

## 2017-06-25 DIAGNOSIS — M25562 Pain in left knee: Secondary | ICD-10-CM | POA: Diagnosis not present

## 2017-06-25 DIAGNOSIS — M1712 Unilateral primary osteoarthritis, left knee: Secondary | ICD-10-CM | POA: Diagnosis not present

## 2017-06-25 DIAGNOSIS — G894 Chronic pain syndrome: Secondary | ICD-10-CM | POA: Diagnosis not present

## 2017-09-13 DIAGNOSIS — R928 Other abnormal and inconclusive findings on diagnostic imaging of breast: Secondary | ICD-10-CM | POA: Diagnosis not present

## 2017-09-13 DIAGNOSIS — E0590 Thyrotoxicosis, unspecified without thyrotoxic crisis or storm: Secondary | ICD-10-CM | POA: Diagnosis not present

## 2017-09-13 DIAGNOSIS — Z853 Personal history of malignant neoplasm of breast: Secondary | ICD-10-CM | POA: Diagnosis not present

## 2017-09-13 DIAGNOSIS — E781 Pure hyperglyceridemia: Secondary | ICD-10-CM | POA: Diagnosis not present

## 2017-09-21 DIAGNOSIS — M48061 Spinal stenosis, lumbar region without neurogenic claudication: Secondary | ICD-10-CM | POA: Diagnosis not present

## 2017-09-21 DIAGNOSIS — Z79899 Other long term (current) drug therapy: Secondary | ICD-10-CM | POA: Diagnosis not present

## 2017-09-21 DIAGNOSIS — M47816 Spondylosis without myelopathy or radiculopathy, lumbar region: Secondary | ICD-10-CM | POA: Diagnosis not present

## 2017-09-21 DIAGNOSIS — G894 Chronic pain syndrome: Secondary | ICD-10-CM | POA: Diagnosis not present

## 2017-10-28 DIAGNOSIS — G894 Chronic pain syndrome: Secondary | ICD-10-CM | POA: Diagnosis not present

## 2017-10-28 DIAGNOSIS — M48061 Spinal stenosis, lumbar region without neurogenic claudication: Secondary | ICD-10-CM | POA: Diagnosis not present

## 2017-10-28 DIAGNOSIS — M47816 Spondylosis without myelopathy or radiculopathy, lumbar region: Secondary | ICD-10-CM | POA: Diagnosis not present

## 2018-01-13 DIAGNOSIS — M48061 Spinal stenosis, lumbar region without neurogenic claudication: Secondary | ICD-10-CM | POA: Diagnosis not present

## 2018-01-13 DIAGNOSIS — M47816 Spondylosis without myelopathy or radiculopathy, lumbar region: Secondary | ICD-10-CM | POA: Diagnosis not present

## 2018-01-13 DIAGNOSIS — G894 Chronic pain syndrome: Secondary | ICD-10-CM | POA: Diagnosis not present

## 2018-01-15 DIAGNOSIS — M48061 Spinal stenosis, lumbar region without neurogenic claudication: Secondary | ICD-10-CM | POA: Diagnosis not present

## 2018-01-15 DIAGNOSIS — M47816 Spondylosis without myelopathy or radiculopathy, lumbar region: Secondary | ICD-10-CM | POA: Diagnosis not present

## 2018-01-15 DIAGNOSIS — M5137 Other intervertebral disc degeneration, lumbosacral region: Secondary | ICD-10-CM | POA: Diagnosis not present

## 2018-02-01 DIAGNOSIS — M48061 Spinal stenosis, lumbar region without neurogenic claudication: Secondary | ICD-10-CM | POA: Diagnosis not present

## 2018-02-01 DIAGNOSIS — Z79899 Other long term (current) drug therapy: Secondary | ICD-10-CM | POA: Diagnosis not present

## 2018-02-01 DIAGNOSIS — G894 Chronic pain syndrome: Secondary | ICD-10-CM | POA: Diagnosis not present

## 2018-02-01 DIAGNOSIS — M47816 Spondylosis without myelopathy or radiculopathy, lumbar region: Secondary | ICD-10-CM | POA: Diagnosis not present

## 2018-02-28 DIAGNOSIS — M545 Low back pain: Secondary | ICD-10-CM | POA: Diagnosis not present

## 2018-02-28 DIAGNOSIS — K219 Gastro-esophageal reflux disease without esophagitis: Secondary | ICD-10-CM | POA: Diagnosis not present

## 2018-02-28 DIAGNOSIS — Z79891 Long term (current) use of opiate analgesic: Secondary | ICD-10-CM | POA: Diagnosis not present

## 2018-02-28 DIAGNOSIS — G894 Chronic pain syndrome: Secondary | ICD-10-CM | POA: Diagnosis not present

## 2018-02-28 DIAGNOSIS — Z79899 Other long term (current) drug therapy: Secondary | ICD-10-CM | POA: Diagnosis not present

## 2018-02-28 DIAGNOSIS — E785 Hyperlipidemia, unspecified: Secondary | ICD-10-CM | POA: Diagnosis not present

## 2018-02-28 DIAGNOSIS — I10 Essential (primary) hypertension: Secondary | ICD-10-CM | POA: Diagnosis not present

## 2018-02-28 DIAGNOSIS — M47816 Spondylosis without myelopathy or radiculopathy, lumbar region: Secondary | ICD-10-CM | POA: Diagnosis not present

## 2018-04-22 DIAGNOSIS — I10 Essential (primary) hypertension: Secondary | ICD-10-CM | POA: Diagnosis not present

## 2018-04-22 DIAGNOSIS — Z90710 Acquired absence of both cervix and uterus: Secondary | ICD-10-CM | POA: Diagnosis not present

## 2018-04-22 DIAGNOSIS — E8941 Symptomatic postprocedural ovarian failure: Secondary | ICD-10-CM | POA: Diagnosis not present

## 2018-04-22 DIAGNOSIS — Z853 Personal history of malignant neoplasm of breast: Secondary | ICD-10-CM | POA: Diagnosis not present

## 2018-06-14 DIAGNOSIS — M170 Bilateral primary osteoarthritis of knee: Secondary | ICD-10-CM | POA: Diagnosis not present

## 2018-06-14 DIAGNOSIS — G8929 Other chronic pain: Secondary | ICD-10-CM | POA: Diagnosis not present

## 2018-06-14 DIAGNOSIS — M47816 Spondylosis without myelopathy or radiculopathy, lumbar region: Secondary | ICD-10-CM | POA: Diagnosis not present

## 2018-06-14 DIAGNOSIS — Z79891 Long term (current) use of opiate analgesic: Secondary | ICD-10-CM | POA: Diagnosis not present

## 2018-06-14 DIAGNOSIS — M25562 Pain in left knee: Secondary | ICD-10-CM | POA: Diagnosis not present

## 2018-06-14 DIAGNOSIS — G894 Chronic pain syndrome: Secondary | ICD-10-CM | POA: Diagnosis not present

## 2018-06-22 DIAGNOSIS — G8929 Other chronic pain: Secondary | ICD-10-CM | POA: Diagnosis not present

## 2018-06-22 DIAGNOSIS — M25562 Pain in left knee: Secondary | ICD-10-CM | POA: Diagnosis not present

## 2018-06-22 DIAGNOSIS — M47816 Spondylosis without myelopathy or radiculopathy, lumbar region: Secondary | ICD-10-CM | POA: Diagnosis not present

## 2018-06-22 DIAGNOSIS — M170 Bilateral primary osteoarthritis of knee: Secondary | ICD-10-CM | POA: Diagnosis not present

## 2018-06-22 DIAGNOSIS — M1712 Unilateral primary osteoarthritis, left knee: Secondary | ICD-10-CM | POA: Diagnosis not present

## 2018-09-13 DIAGNOSIS — Z79891 Long term (current) use of opiate analgesic: Secondary | ICD-10-CM | POA: Diagnosis not present

## 2018-09-13 DIAGNOSIS — M47816 Spondylosis without myelopathy or radiculopathy, lumbar region: Secondary | ICD-10-CM | POA: Diagnosis not present

## 2018-09-13 DIAGNOSIS — M48061 Spinal stenosis, lumbar region without neurogenic claudication: Secondary | ICD-10-CM | POA: Diagnosis not present

## 2018-09-13 DIAGNOSIS — G894 Chronic pain syndrome: Secondary | ICD-10-CM | POA: Diagnosis not present

## 2018-09-30 DIAGNOSIS — M47896 Other spondylosis, lumbar region: Secondary | ICD-10-CM | POA: Diagnosis not present

## 2018-09-30 DIAGNOSIS — M47816 Spondylosis without myelopathy or radiculopathy, lumbar region: Secondary | ICD-10-CM | POA: Diagnosis not present

## 2018-09-30 DIAGNOSIS — M48061 Spinal stenosis, lumbar region without neurogenic claudication: Secondary | ICD-10-CM | POA: Diagnosis not present

## 2018-09-30 DIAGNOSIS — G894 Chronic pain syndrome: Secondary | ICD-10-CM | POA: Diagnosis not present

## 2018-11-04 DIAGNOSIS — N289 Disorder of kidney and ureter, unspecified: Secondary | ICD-10-CM | POA: Diagnosis not present

## 2018-11-04 DIAGNOSIS — R928 Other abnormal and inconclusive findings on diagnostic imaging of breast: Secondary | ICD-10-CM | POA: Diagnosis not present

## 2018-11-04 DIAGNOSIS — I10 Essential (primary) hypertension: Secondary | ICD-10-CM | POA: Diagnosis not present

## 2018-11-04 DIAGNOSIS — E0590 Thyrotoxicosis, unspecified without thyrotoxic crisis or storm: Secondary | ICD-10-CM | POA: Diagnosis not present

## 2018-11-04 DIAGNOSIS — R591 Generalized enlarged lymph nodes: Secondary | ICD-10-CM | POA: Diagnosis not present

## 2018-11-04 DIAGNOSIS — E781 Pure hyperglyceridemia: Secondary | ICD-10-CM | POA: Diagnosis not present

## 2018-11-04 DIAGNOSIS — Z853 Personal history of malignant neoplasm of breast: Secondary | ICD-10-CM | POA: Diagnosis not present

## 2018-11-15 DIAGNOSIS — R590 Localized enlarged lymph nodes: Secondary | ICD-10-CM | POA: Diagnosis not present

## 2018-11-15 DIAGNOSIS — R928 Other abnormal and inconclusive findings on diagnostic imaging of breast: Secondary | ICD-10-CM | POA: Diagnosis not present

## 2018-11-15 DIAGNOSIS — R591 Generalized enlarged lymph nodes: Secondary | ICD-10-CM | POA: Diagnosis not present

## 2018-11-15 DIAGNOSIS — Z853 Personal history of malignant neoplasm of breast: Secondary | ICD-10-CM | POA: Diagnosis not present

## 2018-11-15 DIAGNOSIS — N644 Mastodynia: Secondary | ICD-10-CM | POA: Diagnosis not present

## 2018-11-15 DIAGNOSIS — Z803 Family history of malignant neoplasm of breast: Secondary | ICD-10-CM | POA: Diagnosis not present

## 2018-11-22 DIAGNOSIS — M21762 Unequal limb length (acquired), left tibia: Secondary | ICD-10-CM | POA: Diagnosis not present

## 2018-11-22 DIAGNOSIS — R2689 Other abnormalities of gait and mobility: Secondary | ICD-10-CM | POA: Diagnosis not present

## 2018-11-22 DIAGNOSIS — M2170 Unequal limb length (acquired), unspecified site: Secondary | ICD-10-CM | POA: Diagnosis not present

## 2018-11-22 DIAGNOSIS — M21761 Unequal limb length (acquired), right tibia: Secondary | ICD-10-CM | POA: Diagnosis not present

## 2018-11-23 DIAGNOSIS — Z803 Family history of malignant neoplasm of breast: Secondary | ICD-10-CM | POA: Diagnosis not present

## 2018-11-23 DIAGNOSIS — R590 Localized enlarged lymph nodes: Secondary | ICD-10-CM | POA: Diagnosis not present

## 2018-11-30 DIAGNOSIS — Z79891 Long term (current) use of opiate analgesic: Secondary | ICD-10-CM | POA: Diagnosis not present

## 2018-11-30 DIAGNOSIS — E785 Hyperlipidemia, unspecified: Secondary | ICD-10-CM | POA: Diagnosis not present

## 2018-11-30 DIAGNOSIS — C770 Secondary and unspecified malignant neoplasm of lymph nodes of head, face and neck: Secondary | ICD-10-CM | POA: Diagnosis not present

## 2018-11-30 DIAGNOSIS — Z79899 Other long term (current) drug therapy: Secondary | ICD-10-CM | POA: Diagnosis not present

## 2018-11-30 DIAGNOSIS — K219 Gastro-esophageal reflux disease without esophagitis: Secondary | ICD-10-CM | POA: Diagnosis not present

## 2018-11-30 DIAGNOSIS — I10 Essential (primary) hypertension: Secondary | ICD-10-CM | POA: Diagnosis not present

## 2018-11-30 DIAGNOSIS — Z853 Personal history of malignant neoplasm of breast: Secondary | ICD-10-CM | POA: Diagnosis not present

## 2018-11-30 DIAGNOSIS — R221 Localized swelling, mass and lump, neck: Secondary | ICD-10-CM | POA: Diagnosis not present

## 2018-11-30 DIAGNOSIS — G894 Chronic pain syndrome: Secondary | ICD-10-CM | POA: Diagnosis not present

## 2018-12-22 DIAGNOSIS — C50919 Malignant neoplasm of unspecified site of unspecified female breast: Secondary | ICD-10-CM | POA: Diagnosis not present

## 2018-12-26 DIAGNOSIS — C50919 Malignant neoplasm of unspecified site of unspecified female breast: Secondary | ICD-10-CM | POA: Diagnosis not present

## 2018-12-29 DIAGNOSIS — M48061 Spinal stenosis, lumbar region without neurogenic claudication: Secondary | ICD-10-CM | POA: Diagnosis not present

## 2018-12-29 DIAGNOSIS — M170 Bilateral primary osteoarthritis of knee: Secondary | ICD-10-CM | POA: Diagnosis not present

## 2018-12-29 DIAGNOSIS — G8929 Other chronic pain: Secondary | ICD-10-CM | POA: Diagnosis not present

## 2018-12-29 DIAGNOSIS — C50919 Malignant neoplasm of unspecified site of unspecified female breast: Secondary | ICD-10-CM | POA: Diagnosis not present

## 2018-12-29 DIAGNOSIS — M48062 Spinal stenosis, lumbar region with neurogenic claudication: Secondary | ICD-10-CM | POA: Diagnosis not present

## 2018-12-29 DIAGNOSIS — C7981 Secondary malignant neoplasm of breast: Secondary | ICD-10-CM | POA: Diagnosis not present

## 2018-12-29 DIAGNOSIS — M25562 Pain in left knee: Secondary | ICD-10-CM | POA: Diagnosis not present

## 2018-12-29 DIAGNOSIS — M545 Low back pain: Secondary | ICD-10-CM | POA: Diagnosis not present

## 2019-01-04 DIAGNOSIS — Z170 Estrogen receptor positive status [ER+]: Secondary | ICD-10-CM | POA: Diagnosis not present

## 2019-01-04 DIAGNOSIS — E559 Vitamin D deficiency, unspecified: Secondary | ICD-10-CM | POA: Diagnosis not present

## 2019-01-04 DIAGNOSIS — R978 Other abnormal tumor markers: Secondary | ICD-10-CM | POA: Diagnosis not present

## 2019-01-04 DIAGNOSIS — C50212 Malignant neoplasm of upper-inner quadrant of left female breast: Secondary | ICD-10-CM | POA: Diagnosis not present

## 2019-01-09 DIAGNOSIS — Z170 Estrogen receptor positive status [ER+]: Secondary | ICD-10-CM | POA: Diagnosis not present

## 2019-01-09 DIAGNOSIS — C50212 Malignant neoplasm of upper-inner quadrant of left female breast: Secondary | ICD-10-CM | POA: Diagnosis not present

## 2019-01-10 DIAGNOSIS — Z452 Encounter for adjustment and management of vascular access device: Secondary | ICD-10-CM | POA: Diagnosis not present

## 2019-01-10 DIAGNOSIS — C50919 Malignant neoplasm of unspecified site of unspecified female breast: Secondary | ICD-10-CM | POA: Diagnosis not present

## 2019-01-10 DIAGNOSIS — Z01818 Encounter for other preprocedural examination: Secondary | ICD-10-CM | POA: Diagnosis not present

## 2019-01-10 DIAGNOSIS — R9431 Abnormal electrocardiogram [ECG] [EKG]: Secondary | ICD-10-CM | POA: Diagnosis not present

## 2019-01-12 DIAGNOSIS — M170 Bilateral primary osteoarthritis of knee: Secondary | ICD-10-CM | POA: Diagnosis not present

## 2019-01-12 DIAGNOSIS — C50919 Malignant neoplasm of unspecified site of unspecified female breast: Secondary | ICD-10-CM | POA: Diagnosis not present

## 2019-01-12 DIAGNOSIS — M48061 Spinal stenosis, lumbar region without neurogenic claudication: Secondary | ICD-10-CM | POA: Diagnosis not present

## 2019-01-12 DIAGNOSIS — G8929 Other chronic pain: Secondary | ICD-10-CM | POA: Diagnosis not present

## 2019-01-12 DIAGNOSIS — M48062 Spinal stenosis, lumbar region with neurogenic claudication: Secondary | ICD-10-CM | POA: Diagnosis not present

## 2019-01-12 DIAGNOSIS — M545 Low back pain: Secondary | ICD-10-CM | POA: Diagnosis not present

## 2019-01-12 DIAGNOSIS — M25562 Pain in left knee: Secondary | ICD-10-CM | POA: Diagnosis not present

## 2019-01-13 DIAGNOSIS — N3289 Other specified disorders of bladder: Secondary | ICD-10-CM | POA: Diagnosis not present

## 2019-01-13 DIAGNOSIS — R109 Unspecified abdominal pain: Secondary | ICD-10-CM | POA: Diagnosis not present

## 2019-01-13 DIAGNOSIS — K219 Gastro-esophageal reflux disease without esophagitis: Secondary | ICD-10-CM | POA: Diagnosis not present

## 2019-01-13 DIAGNOSIS — I517 Cardiomegaly: Secondary | ICD-10-CM | POA: Diagnosis not present

## 2019-01-13 DIAGNOSIS — Z859 Personal history of malignant neoplasm, unspecified: Secondary | ICD-10-CM | POA: Diagnosis not present

## 2019-01-13 DIAGNOSIS — C50919 Malignant neoplasm of unspecified site of unspecified female breast: Secondary | ICD-10-CM | POA: Diagnosis not present

## 2019-01-13 DIAGNOSIS — I700 Atherosclerosis of aorta: Secondary | ICD-10-CM | POA: Diagnosis not present

## 2019-01-13 DIAGNOSIS — Z90710 Acquired absence of both cervix and uterus: Secondary | ICD-10-CM | POA: Diagnosis not present

## 2019-01-13 DIAGNOSIS — Z8249 Family history of ischemic heart disease and other diseases of the circulatory system: Secondary | ICD-10-CM | POA: Diagnosis not present

## 2019-01-13 DIAGNOSIS — R1030 Lower abdominal pain, unspecified: Secondary | ICD-10-CM | POA: Diagnosis not present

## 2019-01-13 DIAGNOSIS — Z86018 Personal history of other benign neoplasm: Secondary | ICD-10-CM | POA: Diagnosis not present

## 2019-01-13 DIAGNOSIS — R5082 Postprocedural fever: Secondary | ICD-10-CM | POA: Diagnosis not present

## 2019-01-13 DIAGNOSIS — R509 Fever, unspecified: Secondary | ICD-10-CM | POA: Diagnosis not present

## 2019-01-13 DIAGNOSIS — C770 Secondary and unspecified malignant neoplasm of lymph nodes of head, face and neck: Secondary | ICD-10-CM | POA: Diagnosis not present

## 2019-01-13 DIAGNOSIS — R590 Localized enlarged lymph nodes: Secondary | ICD-10-CM | POA: Diagnosis not present

## 2019-01-13 DIAGNOSIS — R1032 Left lower quadrant pain: Secondary | ICD-10-CM | POA: Diagnosis not present

## 2019-01-13 DIAGNOSIS — R110 Nausea: Secondary | ICD-10-CM | POA: Diagnosis not present

## 2019-01-13 DIAGNOSIS — C50912 Malignant neoplasm of unspecified site of left female breast: Secondary | ICD-10-CM | POA: Diagnosis not present

## 2019-01-13 DIAGNOSIS — C7962 Secondary malignant neoplasm of left ovary: Secondary | ICD-10-CM | POA: Diagnosis not present

## 2019-01-13 DIAGNOSIS — Z79899 Other long term (current) drug therapy: Secondary | ICD-10-CM | POA: Diagnosis not present

## 2019-01-13 DIAGNOSIS — Z79891 Long term (current) use of opiate analgesic: Secondary | ICD-10-CM | POA: Diagnosis not present

## 2019-01-13 DIAGNOSIS — C7982 Secondary malignant neoplasm of genital organs: Secondary | ICD-10-CM | POA: Diagnosis not present

## 2019-01-13 DIAGNOSIS — R197 Diarrhea, unspecified: Secondary | ICD-10-CM | POA: Diagnosis not present

## 2019-01-13 DIAGNOSIS — R1031 Right lower quadrant pain: Secondary | ICD-10-CM | POA: Diagnosis not present

## 2019-01-13 DIAGNOSIS — Z853 Personal history of malignant neoplasm of breast: Secondary | ICD-10-CM | POA: Diagnosis not present

## 2019-01-13 DIAGNOSIS — R102 Pelvic and perineal pain: Secondary | ICD-10-CM | POA: Diagnosis not present

## 2019-01-13 DIAGNOSIS — I340 Nonrheumatic mitral (valve) insufficiency: Secondary | ICD-10-CM | POA: Diagnosis not present

## 2019-01-13 DIAGNOSIS — C5701 Malignant neoplasm of right fallopian tube: Secondary | ICD-10-CM | POA: Diagnosis not present

## 2019-01-13 DIAGNOSIS — Z885 Allergy status to narcotic agent status: Secondary | ICD-10-CM | POA: Diagnosis not present

## 2019-01-13 DIAGNOSIS — R112 Nausea with vomiting, unspecified: Secondary | ICD-10-CM | POA: Diagnosis not present

## 2019-01-13 DIAGNOSIS — C5702 Malignant neoplasm of left fallopian tube: Secondary | ICD-10-CM | POA: Diagnosis not present

## 2019-01-13 DIAGNOSIS — I10 Essential (primary) hypertension: Secondary | ICD-10-CM | POA: Diagnosis not present

## 2019-01-16 DIAGNOSIS — C7982 Secondary malignant neoplasm of genital organs: Secondary | ICD-10-CM | POA: Diagnosis not present

## 2019-01-16 DIAGNOSIS — R1032 Left lower quadrant pain: Secondary | ICD-10-CM | POA: Diagnosis not present

## 2019-01-16 DIAGNOSIS — R5082 Postprocedural fever: Secondary | ICD-10-CM | POA: Diagnosis not present

## 2019-01-16 DIAGNOSIS — C50912 Malignant neoplasm of unspecified site of left female breast: Secondary | ICD-10-CM | POA: Diagnosis not present

## 2019-01-16 DIAGNOSIS — R112 Nausea with vomiting, unspecified: Secondary | ICD-10-CM | POA: Diagnosis not present

## 2019-01-16 DIAGNOSIS — R590 Localized enlarged lymph nodes: Secondary | ICD-10-CM | POA: Diagnosis not present

## 2019-01-16 DIAGNOSIS — Z859 Personal history of malignant neoplasm, unspecified: Secondary | ICD-10-CM | POA: Diagnosis not present

## 2019-01-16 DIAGNOSIS — R1031 Right lower quadrant pain: Secondary | ICD-10-CM | POA: Diagnosis not present

## 2019-01-16 DIAGNOSIS — C770 Secondary and unspecified malignant neoplasm of lymph nodes of head, face and neck: Secondary | ICD-10-CM | POA: Diagnosis not present

## 2019-01-16 DIAGNOSIS — R509 Fever, unspecified: Secondary | ICD-10-CM | POA: Diagnosis not present

## 2019-01-16 DIAGNOSIS — R1030 Lower abdominal pain, unspecified: Secondary | ICD-10-CM | POA: Diagnosis not present

## 2019-01-16 DIAGNOSIS — R197 Diarrhea, unspecified: Secondary | ICD-10-CM | POA: Diagnosis not present

## 2019-01-16 DIAGNOSIS — N3289 Other specified disorders of bladder: Secondary | ICD-10-CM | POA: Diagnosis not present

## 2019-01-16 DIAGNOSIS — R102 Pelvic and perineal pain: Secondary | ICD-10-CM | POA: Diagnosis not present

## 2019-01-25 DIAGNOSIS — C50919 Malignant neoplasm of unspecified site of unspecified female breast: Secondary | ICD-10-CM | POA: Diagnosis not present

## 2019-01-25 DIAGNOSIS — Z8249 Family history of ischemic heart disease and other diseases of the circulatory system: Secondary | ICD-10-CM | POA: Diagnosis not present

## 2019-01-25 DIAGNOSIS — Z79899 Other long term (current) drug therapy: Secondary | ICD-10-CM | POA: Diagnosis not present

## 2019-01-25 DIAGNOSIS — Z885 Allergy status to narcotic agent status: Secondary | ICD-10-CM | POA: Diagnosis not present

## 2019-01-25 DIAGNOSIS — K219 Gastro-esophageal reflux disease without esophagitis: Secondary | ICD-10-CM | POA: Diagnosis not present

## 2019-01-25 DIAGNOSIS — I10 Essential (primary) hypertension: Secondary | ICD-10-CM | POA: Diagnosis not present

## 2019-01-25 DIAGNOSIS — Z452 Encounter for adjustment and management of vascular access device: Secondary | ICD-10-CM | POA: Diagnosis not present

## 2019-01-25 DIAGNOSIS — D649 Anemia, unspecified: Secondary | ICD-10-CM | POA: Diagnosis not present

## 2019-01-31 DIAGNOSIS — C5700 Malignant neoplasm of unspecified fallopian tube: Secondary | ICD-10-CM | POA: Diagnosis not present

## 2019-02-01 DIAGNOSIS — C5701 Malignant neoplasm of right fallopian tube: Secondary | ICD-10-CM | POA: Diagnosis not present

## 2019-02-07 DIAGNOSIS — C50919 Malignant neoplasm of unspecified site of unspecified female breast: Secondary | ICD-10-CM | POA: Diagnosis not present

## 2019-02-16 DIAGNOSIS — C5701 Malignant neoplasm of right fallopian tube: Secondary | ICD-10-CM | POA: Diagnosis not present

## 2019-02-20 DIAGNOSIS — C5701 Malignant neoplasm of right fallopian tube: Secondary | ICD-10-CM | POA: Diagnosis not present

## 2019-02-20 DIAGNOSIS — M545 Low back pain: Secondary | ICD-10-CM | POA: Diagnosis not present

## 2019-02-20 DIAGNOSIS — Z5111 Encounter for antineoplastic chemotherapy: Secondary | ICD-10-CM | POA: Diagnosis not present

## 2019-02-22 DIAGNOSIS — C5701 Malignant neoplasm of right fallopian tube: Secondary | ICD-10-CM | POA: Diagnosis not present

## 2019-03-06 DIAGNOSIS — C50212 Malignant neoplasm of upper-inner quadrant of left female breast: Secondary | ICD-10-CM | POA: Diagnosis not present

## 2019-03-06 DIAGNOSIS — C5701 Malignant neoplasm of right fallopian tube: Secondary | ICD-10-CM | POA: Diagnosis not present

## 2019-03-06 DIAGNOSIS — C50919 Malignant neoplasm of unspecified site of unspecified female breast: Secondary | ICD-10-CM | POA: Diagnosis not present

## 2019-03-06 DIAGNOSIS — C5710 Malignant neoplasm of unspecified broad ligament: Secondary | ICD-10-CM | POA: Diagnosis not present

## 2019-03-13 DIAGNOSIS — Z5111 Encounter for antineoplastic chemotherapy: Secondary | ICD-10-CM | POA: Diagnosis not present

## 2019-03-13 DIAGNOSIS — C5701 Malignant neoplasm of right fallopian tube: Secondary | ICD-10-CM | POA: Diagnosis not present

## 2019-03-27 DIAGNOSIS — C5701 Malignant neoplasm of right fallopian tube: Secondary | ICD-10-CM | POA: Diagnosis not present

## 2019-03-29 DIAGNOSIS — K219 Gastro-esophageal reflux disease without esophagitis: Secondary | ICD-10-CM | POA: Diagnosis not present

## 2019-03-29 DIAGNOSIS — I10 Essential (primary) hypertension: Secondary | ICD-10-CM | POA: Diagnosis not present

## 2019-03-29 DIAGNOSIS — C5702 Malignant neoplasm of left fallopian tube: Secondary | ICD-10-CM | POA: Diagnosis not present

## 2019-03-29 DIAGNOSIS — N644 Mastodynia: Secondary | ICD-10-CM | POA: Diagnosis not present

## 2019-03-29 DIAGNOSIS — M5417 Radiculopathy, lumbosacral region: Secondary | ICD-10-CM | POA: Diagnosis not present

## 2019-03-29 DIAGNOSIS — D352 Benign neoplasm of pituitary gland: Secondary | ICD-10-CM | POA: Diagnosis not present

## 2019-03-29 DIAGNOSIS — N951 Menopausal and female climacteric states: Secondary | ICD-10-CM | POA: Diagnosis not present

## 2019-04-03 DIAGNOSIS — C5701 Malignant neoplasm of right fallopian tube: Secondary | ICD-10-CM | POA: Diagnosis not present

## 2019-04-03 DIAGNOSIS — Z5111 Encounter for antineoplastic chemotherapy: Secondary | ICD-10-CM | POA: Diagnosis not present

## 2019-04-13 DIAGNOSIS — N951 Menopausal and female climacteric states: Secondary | ICD-10-CM | POA: Diagnosis not present

## 2019-04-13 DIAGNOSIS — I10 Essential (primary) hypertension: Secondary | ICD-10-CM | POA: Diagnosis not present

## 2019-04-13 DIAGNOSIS — K219 Gastro-esophageal reflux disease without esophagitis: Secondary | ICD-10-CM | POA: Diagnosis not present

## 2019-04-17 DIAGNOSIS — C5701 Malignant neoplasm of right fallopian tube: Secondary | ICD-10-CM | POA: Diagnosis not present

## 2019-04-17 DIAGNOSIS — C50212 Malignant neoplasm of upper-inner quadrant of left female breast: Secondary | ICD-10-CM | POA: Diagnosis not present

## 2019-04-18 DIAGNOSIS — M48061 Spinal stenosis, lumbar region without neurogenic claudication: Secondary | ICD-10-CM | POA: Diagnosis not present

## 2019-04-18 DIAGNOSIS — M47816 Spondylosis without myelopathy or radiculopathy, lumbar region: Secondary | ICD-10-CM | POA: Diagnosis not present

## 2019-04-18 DIAGNOSIS — Z79891 Long term (current) use of opiate analgesic: Secondary | ICD-10-CM | POA: Diagnosis not present

## 2019-04-18 DIAGNOSIS — M25562 Pain in left knee: Secondary | ICD-10-CM | POA: Diagnosis not present

## 2019-04-18 DIAGNOSIS — M5010 Cervical disc disorder with radiculopathy, unspecified cervical region: Secondary | ICD-10-CM | POA: Diagnosis not present

## 2019-04-18 DIAGNOSIS — G894 Chronic pain syndrome: Secondary | ICD-10-CM | POA: Diagnosis not present

## 2019-04-19 DIAGNOSIS — N6320 Unspecified lump in the left breast, unspecified quadrant: Secondary | ICD-10-CM | POA: Diagnosis not present

## 2019-04-19 DIAGNOSIS — N6002 Solitary cyst of left breast: Secondary | ICD-10-CM | POA: Diagnosis not present

## 2019-04-19 DIAGNOSIS — N6321 Unspecified lump in the left breast, upper outer quadrant: Secondary | ICD-10-CM | POA: Diagnosis not present

## 2019-04-19 DIAGNOSIS — N6459 Other signs and symptoms in breast: Secondary | ICD-10-CM | POA: Diagnosis not present

## 2019-04-19 DIAGNOSIS — N644 Mastodynia: Secondary | ICD-10-CM | POA: Diagnosis not present

## 2019-04-19 DIAGNOSIS — N6315 Unspecified lump in the right breast, overlapping quadrants: Secondary | ICD-10-CM | POA: Diagnosis not present

## 2019-04-21 DIAGNOSIS — N644 Mastodynia: Secondary | ICD-10-CM | POA: Diagnosis not present

## 2019-04-21 DIAGNOSIS — R922 Inconclusive mammogram: Secondary | ICD-10-CM | POA: Diagnosis not present

## 2019-04-21 DIAGNOSIS — I10 Essential (primary) hypertension: Secondary | ICD-10-CM | POA: Diagnosis not present

## 2019-04-21 DIAGNOSIS — K219 Gastro-esophageal reflux disease without esophagitis: Secondary | ICD-10-CM | POA: Diagnosis not present

## 2019-04-24 DIAGNOSIS — C5701 Malignant neoplasm of right fallopian tube: Secondary | ICD-10-CM | POA: Diagnosis not present

## 2019-04-24 DIAGNOSIS — Z5111 Encounter for antineoplastic chemotherapy: Secondary | ICD-10-CM | POA: Diagnosis not present

## 2019-04-28 DIAGNOSIS — C5701 Malignant neoplasm of right fallopian tube: Secondary | ICD-10-CM | POA: Diagnosis not present

## 2019-04-28 DIAGNOSIS — R590 Localized enlarged lymph nodes: Secondary | ICD-10-CM | POA: Diagnosis not present

## 2019-04-28 DIAGNOSIS — R1900 Intra-abdominal and pelvic swelling, mass and lump, unspecified site: Secondary | ICD-10-CM | POA: Diagnosis not present

## 2019-04-28 DIAGNOSIS — C50212 Malignant neoplasm of upper-inner quadrant of left female breast: Secondary | ICD-10-CM | POA: Diagnosis not present

## 2019-05-08 DIAGNOSIS — C5701 Malignant neoplasm of right fallopian tube: Secondary | ICD-10-CM | POA: Diagnosis not present

## 2019-05-11 DIAGNOSIS — C5702 Malignant neoplasm of left fallopian tube: Secondary | ICD-10-CM | POA: Diagnosis not present

## 2019-05-11 DIAGNOSIS — I10 Essential (primary) hypertension: Secondary | ICD-10-CM | POA: Diagnosis not present

## 2019-05-11 DIAGNOSIS — R922 Inconclusive mammogram: Secondary | ICD-10-CM | POA: Diagnosis not present

## 2019-05-15 DIAGNOSIS — C5701 Malignant neoplasm of right fallopian tube: Secondary | ICD-10-CM | POA: Diagnosis not present

## 2019-05-15 DIAGNOSIS — Z5111 Encounter for antineoplastic chemotherapy: Secondary | ICD-10-CM | POA: Diagnosis not present

## 2019-05-16 DIAGNOSIS — G894 Chronic pain syndrome: Secondary | ICD-10-CM | POA: Diagnosis not present

## 2019-05-16 DIAGNOSIS — Z853 Personal history of malignant neoplasm of breast: Secondary | ICD-10-CM | POA: Diagnosis not present

## 2019-05-16 DIAGNOSIS — M48061 Spinal stenosis, lumbar region without neurogenic claudication: Secondary | ICD-10-CM | POA: Diagnosis not present

## 2019-05-16 DIAGNOSIS — Z79891 Long term (current) use of opiate analgesic: Secondary | ICD-10-CM | POA: Diagnosis not present

## 2019-05-21 DIAGNOSIS — I10 Essential (primary) hypertension: Secondary | ICD-10-CM | POA: Diagnosis not present

## 2019-05-21 DIAGNOSIS — Z885 Allergy status to narcotic agent status: Secondary | ICD-10-CM | POA: Diagnosis not present

## 2019-05-21 DIAGNOSIS — E860 Dehydration: Secondary | ICD-10-CM | POA: Diagnosis not present

## 2019-05-21 DIAGNOSIS — R079 Chest pain, unspecified: Secondary | ICD-10-CM | POA: Diagnosis not present

## 2019-05-21 DIAGNOSIS — R42 Dizziness and giddiness: Secondary | ICD-10-CM | POA: Diagnosis not present

## 2019-05-21 DIAGNOSIS — R531 Weakness: Secondary | ICD-10-CM | POA: Diagnosis not present
